# Patient Record
Sex: Male | Born: 1940 | Race: White | Hispanic: No | Marital: Single | Smoking: Never smoker
Health system: Southern US, Community
[De-identification: ages and names within clinical notes are randomized; demographics above are authoritative.]

## PROBLEM LIST (undated history)

## (undated) DIAGNOSIS — M4310 Spondylolisthesis, site unspecified: Secondary | ICD-10-CM

## (undated) DIAGNOSIS — K219 Gastro-esophageal reflux disease without esophagitis: Secondary | ICD-10-CM

## (undated) HISTORY — PX: OTHER SURGICAL HISTORY: SHX169

## (undated) HISTORY — PX: ANTERIOR CERVICAL DECOMP/DISCECTOMY FUSION: SHX1161

---

## 1998-03-25 ENCOUNTER — Other Ambulatory Visit: Admission: RE | Admit: 1998-03-25 | Discharge: 1998-03-25 | Payer: Self-pay | Admitting: Family Medicine

## 1998-11-19 ENCOUNTER — Encounter: Payer: Self-pay | Admitting: Neurosurgery

## 1998-11-20 ENCOUNTER — Ambulatory Visit (HOSPITAL_COMMUNITY): Admission: RE | Admit: 1998-11-20 | Discharge: 1998-11-20 | Payer: Self-pay | Admitting: Neurosurgery

## 1998-11-20 ENCOUNTER — Encounter: Payer: Self-pay | Admitting: Neurosurgery

## 1998-11-21 ENCOUNTER — Inpatient Hospital Stay (HOSPITAL_COMMUNITY): Admission: RE | Admit: 1998-11-21 | Discharge: 1998-11-22 | Payer: Self-pay | Admitting: Neurosurgery

## 1998-11-21 ENCOUNTER — Encounter: Payer: Self-pay | Admitting: Neurosurgery

## 2000-08-25 ENCOUNTER — Ambulatory Visit (HOSPITAL_BASED_OUTPATIENT_CLINIC_OR_DEPARTMENT_OTHER): Admission: RE | Admit: 2000-08-25 | Discharge: 2000-08-25 | Payer: Self-pay | Admitting: Orthopedic Surgery

## 2010-06-03 ENCOUNTER — Ambulatory Visit: Payer: Self-pay | Admitting: Family Medicine

## 2010-06-03 DIAGNOSIS — F329 Major depressive disorder, single episode, unspecified: Secondary | ICD-10-CM

## 2010-06-03 DIAGNOSIS — F528 Other sexual dysfunction not due to a substance or known physiological condition: Secondary | ICD-10-CM | POA: Insufficient documentation

## 2010-06-03 LAB — CONVERTED CEMR LAB
ALT: 19 units/L (ref 0–53)
AST: 17 units/L (ref 0–37)
Albumin: 3.9 g/dL (ref 3.5–5.2)
Alkaline Phosphatase: 51 units/L (ref 39–117)
BUN: 14 mg/dL (ref 6–23)
Basophils Absolute: 0 10*3/uL (ref 0.0–0.1)
Basophils Relative: 0.9 % (ref 0.0–3.0)
Bilirubin Urine: NEGATIVE
Bilirubin, Direct: 0.1 mg/dL (ref 0.0–0.3)
Blood in Urine, dipstick: NEGATIVE
CO2: 31 meq/L (ref 19–32)
Calcium: 8.8 mg/dL (ref 8.4–10.5)
Chloride: 109 meq/L (ref 96–112)
Cholesterol: 159 mg/dL (ref 0–200)
Creatinine, Ser: 0.8 mg/dL (ref 0.4–1.5)
Eosinophils Absolute: 0.2 10*3/uL (ref 0.0–0.7)
Eosinophils Relative: 3.6 % (ref 0.0–5.0)
GFR calc non Af Amer: 100.56 mL/min (ref >60)
Glucose, Bld: 92 mg/dL (ref 70–99)
Glucose, Urine, Semiquant: NEGATIVE
HCT: 43.8 % (ref 39.0–52.0)
HDL: 39.6 mg/dL (ref >39.00)
Hemoglobin: 14.8 g/dL (ref 13.0–17.0)
Ketones, urine, test strip: NEGATIVE
LDL Cholesterol: 103 mg/dL — ABNORMAL HIGH (ref 0–99)
Lymphocytes Relative: 30.3 % (ref 12.0–46.0)
Lymphs Abs: 1.7 10*3/uL (ref 0.7–4.0)
MCHC: 33.7 g/dL (ref 30.0–36.0)
MCV: 92.2 fL (ref 78.0–100.0)
Monocytes Absolute: 0.5 10*3/uL (ref 0.1–1.0)
Monocytes Relative: 9.5 % (ref 3.0–12.0)
Neutro Abs: 3.1 10*3/uL (ref 1.4–7.7)
Neutrophils Relative %: 55.7 % (ref 43.0–77.0)
Nitrite: NEGATIVE
PSA: 2.4 ng/mL (ref 0.10–4.00)
Platelets: 243 10*3/uL (ref 150.0–400.0)
Potassium: 4.4 meq/L (ref 3.5–5.1)
Protein, U semiquant: NEGATIVE
RBC: 4.75 M/uL (ref 4.22–5.81)
RDW: 15 % — ABNORMAL HIGH (ref 11.5–14.6)
Sodium: 141 meq/L (ref 135–145)
Specific Gravity, Urine: 1.02
TSH: 0.53 microintl units/mL (ref 0.35–5.50)
Total Bilirubin: 0.9 mg/dL (ref 0.3–1.2)
Total CHOL/HDL Ratio: 4
Total Protein: 7 g/dL (ref 6.0–8.3)
Triglycerides: 84 mg/dL (ref 0.0–149.0)
Urobilinogen, UA: 0.2
VLDL: 16.8 mg/dL (ref 0.0–40.0)
WBC Urine, dipstick: NEGATIVE
WBC: 5.5 10*3/uL (ref 4.5–10.5)
pH: 7

## 2010-07-16 ENCOUNTER — Ambulatory Visit: Payer: Self-pay | Admitting: Family Medicine

## 2010-10-30 ENCOUNTER — Emergency Department (HOSPITAL_COMMUNITY)
Admission: EM | Admit: 2010-10-30 | Discharge: 2010-10-30 | Payer: Self-pay | Source: Home / Self Care | Admitting: Emergency Medicine

## 2010-12-18 NOTE — Assessment & Plan Note (Signed)
Summary: new medicare pt/ pt will come in fasting/ok per doc/njr   Vital Signs:  Patient profile:   70 year old male Height:      70 inches Weight:      195 pounds BMI:     28.08 Temp:     98.2 degrees F oral BP sitting:   108 / 80  (left arm) Cuff size:   regular  Vitals Entered By: Kern Reap CMA Duncan Dull) (June 03, 2010 9:53 AM) CC: new to establish Is Patient Diabetic? No Pain Assessment Patient in pain? no        CC:  new to establish.  History of Present Illness:  Jared Hardy is a 70 year old, married x 2, divorced x 2, male, nonsmoker, who comes in today as a new patient for general physical examination and to discuss depression and erectile dysfunction.  He and his family were patients of mine many years ago.  I diagnosed his 17-year-old daughter to have acute leukemia.  She subsequently died.  His son Genelle Bal is now 37 years of age and lives in [place], [place].  He is divorced from his first wife remarried and divorced.  Again.  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  He has noticed a modest decrease in mood, mild depression.  No sleep dysfunction.  He also has some mild erectile dysfunction and would like to discuss fiber.  He gets routine eye care, dental care, colonoscopy, and GI x 4, normal, urologic exam yearly by Dr. Brunilda Payor.......... he wishes to continue that. relationship...Marland KitchenMarland KitchenMarland Kitchen vaccinations unknown, and will get copy from previous doctor Here for Medicare AWV:  1.   Risk factors based on Past M, S, F history:,,,,,,reviewed in detail 2.   Physical Activities: .......Marland Kitchenwalks daily 3.   Depression/mood: .......depressed as noted above 4.   Hearing: .......normal 5.   ADL's: ........normal 6.   Fall Risk:.......Marland Kitchenreviewed  7.   Home Safety: ........Marland Kitchenreviewed 8.   Height, weight, &visual acuity:.....Marland Kitchenheight weight, normal.  Vision normal 10.   Labs ordered based on risk factors:..........done today  11.           Referral  Coordination.........none 12.           Care Plan...........discuss medication use..... Celexa, and Viagra 13.            Cognitive Assessment ..........normal  Preventive Screening-Counseling & Management  Alcohol-Tobacco     Smoking Status: current     Packs/Day: <0.25      Drug Use:  no.    Allergies (verified): No Known Drug Allergies  Past History:  Past Medical History:  hearing loss  Past Surgical History: cervical anterior disketomy  Family History: Father: deceased Mother: deceased - colon caner Siblings: 1 brother  Social History: Occupation:financial advisor Single Current Smoker Alcohol use-yes Drug use-no Packs/Day:  <0.25 Smoking Status:  current Drug Use:  no  Review of Systems      See HPI  Physical Exam  General:  Well-developed,well-nourished,in no acute distress; alert,appropriate and cooperative throughout examination Head:  Normocephalic and atraumatic without obvious abnormalities. No apparent alopecia or balding. Eyes:  No corneal or conjunctival inflammation noted. EOMI. Perrla. Funduscopic exam benign, without hemorrhages, exudates or papilledema. Vision grossly normal. Ears:  External ear exam shows no significant lesions or deformities.  Otoscopic examination reveals clear canals, tympanic membranes are intact bilaterally without bulging, retraction, inflammation or discharge. Hearing is grossly normal bilaterally. Nose:  External nasal examination shows no deformity or inflammation. Nasal mucosa  are pink and moist without lesions or exudates. Mouth:  Oral mucosa and oropharynx without lesions or exudates.  Teeth in good repair. Neck:  No deformities, masses, or tenderness noted. Chest Wall:  No deformities, masses, tenderness or gynecomastia noted. Breasts:  No masses or gynecomastia noted Lungs:  Normal respiratory effort, chest expands symmetrically. Lungs are clear to auscultation, no crackles or wheezes. Heart:  Normal rate and  regular rhythm. S1 and S2 normal without gallop, murmur, click, rub or other extra sounds. Abdomen:  Bowel sounds positive,abdomen soft and non-tender without masses, organomegaly or hernias noted. Rectal:  uro Genitalia:  uro Prostate:  uro Msk:  No deformity or scoliosis noted of thoracic or lumbar spine.   Pulses:  R and L carotid,radial,femoral,dorsalis pedis and posterior tibial pulses are full and equal bilaterally Extremities:  No clubbing, cyanosis, edema, or deformity noted with normal full range of motion of all joints.   Neurologic:  No cranial nerve deficits noted. Station and gait are normal. Plantar reflexes are down-going bilaterally. DTRs are symmetrical throughout. Sensory, motor and coordinative functions appear intact. Skin:  Intact without suspicious lesions or rashes Cervical Nodes:  No lymphadenopathy noted Axillary Nodes:  No palpable lymphadenopathy Inguinal Nodes:  No significant adenopathy Psych:  Oriented X3 and flat affect.     Problems:  Medical Problems Added: 1)  Dx of Routine General Medical Exam@health  Care Facl  (ICD-V70.0) 2)  Dx of Erectile Dysfunction  (ICD-302.72) 3)  Dx of Depressive Disorder  (ICD-311)  Impression & Recommendations:  Problem # 1:  ERECTILE DYSFUNCTION (ICD-302.72) Assessment New  His updated medication list for this problem includes:    Viagra 50 Mg Tabs (Sildenafil citrate) ..... Uad  Orders: Venipuncture (16109) First annual wellness visit with prevention plan  [phone]) Urinalysis-dipstick only (Medicare patient) (09811BJ) TLB-Lipid Panel (80061-LIPID) TLB-BMP (Basic Metabolic Panel-BMET) (80048-METABOL) TLB-CBC Platelet - w/Differential (85025-CBCD) TLB-Hepatic/Liver Function Pnl (80076-HEPATIC) TLB-TSH (Thyroid Stimulating Hormone) (84443-TSH) TLB-PSA (Prostate Specific Antigen) (84153-PSA)  Problem # 2:  DEPRESSIVE DISORDER (ICD-311) Assessment: New  His updated medication list for this problem includes:     Celexa 20 Mg Tabs (Citalopram hydrobromide) .Marland Kitchen... 1 tab @ bedtime  Orders: Venipuncture (47829) First annual wellness visit with prevention plan  [phone]) Urinalysis-dipstick only (Medicare patient) (08657QI) TLB-Lipid Panel (80061-LIPID) TLB-BMP (Basic Metabolic Panel-BMET) (80048-METABOL) TLB-CBC Platelet - w/Differential (85025-CBCD) TLB-Hepatic/Liver Function Pnl (80076-HEPATIC) TLB-TSH (Thyroid Stimulating Hormone) (84443-TSH) TLB-PSA (Prostate Specific Antigen) (84153-PSA)  Problem # 3:  ROUTINE GENERAL MEDICAL EXAM@HEALTH  CARE FACL (ICD-V70.0) Assessment: New  Orders: Venipuncture (69629) First annual wellness visit with prevention plan  [phone]) Urinalysis-dipstick only (Medicare patient) (32440NU) EKG w/ Interpretation (93000) TLB-Lipid Panel (80061-LIPID) TLB-BMP (Basic Metabolic Panel-BMET) (80048-METABOL) TLB-CBC Platelet - w/Differential (85025-CBCD) TLB-Hepatic/Liver Function Pnl (80076-HEPATIC) TLB-TSH (Thyroid Stimulating Hormone) (84443-TSH) TLB-PSA (Prostate Specific Antigen) (84153-PSA)  Complete Medication List: 1)  Celexa 20 Mg Tabs (Citalopram hydrobromide) .Marland Kitchen.. 1 tab @ bedtime 2)  Viagra 50 Mg Tabs (Sildenafil citrate) .... Uad  Patient Instructions: 1)  begin Celexa 20 mg nightly return in 6 weeks for follow-up. 2)  Also, Viagra 50 mg........ one half tablet an hour prior to sex with water. 3)  Please give [place] a copy of your vaccination records 4)  It is important that you exercise regularly at least 20 minutes 5 times a week. If you develop chest pain, have severe difficulty breathing, or feel very tired , stop exercising immediately and seek medical attention. 5)  Take an Aspirin every day. Prescriptions: VIAGRA 50 MG TABS (SILDENAFIL CITRATE)  UAD  #6 x 11   Entered and Authorized by:   Roderick Pee MD   Signed by:   Roderick Pee MD on 06/03/2010   Method used:   Print then Give to Patient   RxID:   [id number] CELEXA 20 MG TABS (CITALOPRAM  HYDROBROMIDE) 1 tab @ bedtime  #50 x 1   Entered and Authorized by:   Roderick Pee MD   Signed by:   Roderick Pee MD on 06/03/2010   Method used:   Print then Give to Patient   RxID:   [id number]   Laboratory Results   Urine Tests  Date/Time Recieved: June 03, 2010 12:24 PM  Date/Time Reported: June 03, 2010 12:24 PM   Routine Urinalysis   Color: yellow Appearance: Clear Glucose: negative   (Normal Range: Negative) Bilirubin: negative   (Normal Range: Negative) Ketone: negative   (Normal Range: Negative) Spec. Gravity: 1.020   (Normal Range: 1.003-1.035) Blood: negative   (Normal Range: Negative) pH: 7.0   (Normal Range: 5.0-8.0) Protein: negative   (Normal Range: Negative) Urobilinogen: 0.2   (Normal Range: 0-1) Nitrite: negative   (Normal Range: Negative) Leukocyte Esterace: negative   (Normal Range: Negative)    Comments: Wynona Canes, CMA  June 03, 2010 12:24 PM

## 2010-12-18 NOTE — Assessment & Plan Note (Signed)
Summary: 6 wk follow up/cb   Vital Signs:  Patient profile:   70 year old male Weight:      198 pounds Temp:     97.9 degrees F oral BP sitting:   120 / 70  (left arm) Cuff size:   regular  Vitals Entered By: Kathrynn Speed CMA (July 16, 2010 8:57 AM) CC: 6 wk fu, src Flu Vaccine Consent Questions     Do you have a history of severe allergic reactions to this vaccine? no    Any prior history of allergic reactions to egg and/or gelatin? no    Do you have a sensitivity to the preservative Thimersol? no    Do you have a past history of Guillan-Barre Syndrome? no    Do you currently have an acute febrile illness? no    Have you ever had a severe reaction to latex? no    Vaccine information given and explained to patient? yes    Are you currently pregnant? no    Lot Number:[id number]   Exp Date:05/16/2011   Site Given  Left Deltoid IM   CC:  6 wk fu and src.  History of Present Illness: Jared Hardy  is a 70 year old male, who comes in today for reevaluation of depression, and erectile dysfunction.  He was taking Celexa 20 mg daily however, he states it did not help.  His mood and it caused a very negative effect on his libido therefore, he would like to get off of it.  He had some improvement with the 25 mg of Viagra however, would like to increase the dose.  Current Medications (verified): 1)  Celexa 20 Mg Tabs (Citalopram Hydrobromide) .Marland Kitchen.. 1 Tab @ Bedtime 2)  Viagra 50 Mg Tabs (Sildenafil Citrate) .... Uad  Allergies (verified): No Known Drug Allergies  Physical Exam  General:  Well-developed,well-nourished,in no acute distress; alert,appropriate and cooperative throughout examination   Impression & Recommendations:  Problem # 1:  ERECTILE DYSFUNCTION (ICD-302.72) Assessment Improved  His updated medication list for this problem includes:    Viagra 50 Mg Tabs (Sildenafil citrate) ..... Uad    Viagra 100 Mg Tabs (Sildenafil citrate) ..... Uad  Problem # 2:  DEPRESSIVE  DISORDER (ICD-311) Assessment: Unchanged  His updated medication list for this problem includes:    Celexa 20 Mg Tabs (Citalopram hydrobromide) .Marland Kitchen... 1 tab @ bedtime  Complete Medication List: 1)  Celexa 20 Mg Tabs (Citalopram hydrobromide) .Marland Kitchen.. 1 tab @ bedtime 2)  Viagra 50 Mg Tabs (Sildenafil citrate) .... Uad 3)  Viagra 100 Mg Tabs (Sildenafil citrate) .... Uad  Other Orders: Flu Vaccine 66yrs + (19147) Administration Flu vaccine - MCR (W2956)  Patient Instructions: 1)  taper these Celexa by taking a half a tablet every other day until the bottles empty. 2)  Let's increase the Viagra to 100 mg p.r.n. Prescriptions: VIAGRA 100 MG TABS (SILDENAFIL CITRATE) UAD  #6 x 11   Entered and Authorized by:   Roderick Pee MD   Signed by:   Roderick Pee MD on 07/16/2010   Method used:   Print then Give to Patient   RxID:   [id number]

## 2011-03-23 ENCOUNTER — Other Ambulatory Visit: Payer: Self-pay | Admitting: Neurosurgery

## 2011-03-23 DIAGNOSIS — M47816 Spondylosis without myelopathy or radiculopathy, lumbar region: Secondary | ICD-10-CM

## 2011-03-23 DIAGNOSIS — M48061 Spinal stenosis, lumbar region without neurogenic claudication: Secondary | ICD-10-CM

## 2011-03-26 ENCOUNTER — Ambulatory Visit
Admission: RE | Admit: 2011-03-26 | Discharge: 2011-03-26 | Disposition: A | Discharge status: Home / Self Care | Payer: Medicare Other | Source: Ambulatory Visit | Attending: Neurosurgery | Admitting: Neurosurgery

## 2011-03-26 ENCOUNTER — Other Ambulatory Visit: Payer: Self-pay

## 2011-03-26 DIAGNOSIS — M47816 Spondylosis without myelopathy or radiculopathy, lumbar region: Secondary | ICD-10-CM

## 2011-03-26 DIAGNOSIS — M48061 Spinal stenosis, lumbar region without neurogenic claudication: Secondary | ICD-10-CM

## 2011-04-03 NOTE — Op Note (Signed)
[place], [place]  Patient:    Jared Hardy, Jared Hardy                    MRN: [id number] Proc. Date: 08/25/00 Adm. Date:  [id number] Attending:  Marlowe Shores                           Operative Report  PREOPERATIVE DIAGNOSIS:  Retained foreign body, right thumb.  POSTOPERATIVE DIAGNOSIS:  Retained foreign body, right thumb.  PROCEDURE:  Removal of foreign body, deep right thumb.  SURGEON:  Artist Pais. Mina Marble, M.D.  ANESTHESIA:  IV sedation with local 2% plain Lidocaine block.  ASSISTANT:  Junius Roads. Ireton, P.A.C.  TOURNIQUET:  None.  COMPLICATIONS:  None.  DRAINS:  None.  PROCEDURE:  Patient was taken to operating room, and after the induction of adequate of IV sedation, the right upper extremity was prepped and draped in the usual sterile fashion.  Once this was done, 2% plain Lidocaine was injected into the distal aspect of the thumb along the radial border, distal to the IP flexion crease.  Once adequate anesthesia was obtained, a 2 cm incision was made in this area. Dissection was carried down to the area overlying the tendinous insertion of the SPL.  Once this was done, a wooden splinter was encountered.  This was approximately 15 mm at its greatest length, and probably 5 mm at its greatest diameter.  This was carefully excised.  The wound was then thoroughly irrigated and closed loosely with 5-0 nylon, dressed with Xeroform, 4x4 fluffs, and compressive Coban dressing.  Patient tolerated the procedure well, went to the recovery room in stable fashion. DD:  08/25/00 TD:  08/26/00 Job: [id number]

## 2011-10-22 ENCOUNTER — Encounter: Payer: Self-pay | Admitting: Family Medicine

## 2011-10-22 ENCOUNTER — Ambulatory Visit (INDEPENDENT_AMBULATORY_CARE_PROVIDER_SITE_OTHER): Payer: Medicare Other | Admitting: Family Medicine

## 2011-10-22 VITALS — BP 110/76 | Temp 97.6°F | Ht 69.0 in | Wt 197.0 lb

## 2011-10-22 DIAGNOSIS — Z136 Encounter for screening for cardiovascular disorders: Secondary | ICD-10-CM

## 2011-10-22 DIAGNOSIS — E7889 Other lipoprotein metabolism disorders: Secondary | ICD-10-CM

## 2011-10-22 DIAGNOSIS — N138 Other obstructive and reflux uropathy: Secondary | ICD-10-CM

## 2011-10-22 DIAGNOSIS — N139 Obstructive and reflux uropathy, unspecified: Secondary | ICD-10-CM

## 2011-10-22 DIAGNOSIS — N401 Enlarged prostate with lower urinary tract symptoms: Secondary | ICD-10-CM

## 2011-10-22 DIAGNOSIS — Z0000 Encounter for general adult medical examination without abnormal findings: Secondary | ICD-10-CM | POA: Insufficient documentation

## 2011-10-22 DIAGNOSIS — N529 Male erectile dysfunction, unspecified: Secondary | ICD-10-CM

## 2011-10-22 DIAGNOSIS — R5381 Other malaise: Secondary | ICD-10-CM

## 2011-10-22 DIAGNOSIS — R5383 Other fatigue: Secondary | ICD-10-CM

## 2011-10-22 DIAGNOSIS — E789 Disorder of lipoprotein metabolism, unspecified: Secondary | ICD-10-CM

## 2011-10-22 LAB — POCT URINALYSIS DIPSTICK
Blood, UA: NEGATIVE
Glucose, UA: NEGATIVE
Nitrite, UA: NEGATIVE
Spec Grav, UA: 1.02
Urobilinogen, UA: 0.2
pH, UA: 7

## 2011-10-22 LAB — BASIC METABOLIC PANEL
BUN: 16 mg/dL (ref 6–23)
CO2: 28 mEq/L (ref 19–32)
Calcium: 9.2 mg/dL (ref 8.4–10.5)
Chloride: 104 mEq/L (ref 96–112)
Creatinine, Ser: 0.9 mg/dL (ref 0.4–1.5)
GFR: 86.46 mL/min (ref >60.00)
Glucose, Bld: 101 mg/dL — ABNORMAL HIGH (ref 70–99)
Potassium: 4.7 mEq/L (ref 3.5–5.1)
Sodium: 140 mEq/L (ref 135–145)

## 2011-10-22 LAB — TSH: TSH: 0.91 u[IU]/mL (ref 0.35–5.50)

## 2011-10-22 LAB — LIPID PANEL
Cholesterol: 176 mg/dL (ref 0–200)
LDL Cholesterol: 117 mg/dL — ABNORMAL HIGH (ref 0–99)
VLDL: 13.2 mg/dL (ref 0.0–40.0)

## 2011-10-22 LAB — CBC WITH DIFFERENTIAL/PLATELET
Basophils Absolute: 0 10*3/uL (ref 0.0–0.1)
Basophils Relative: 0.7 % (ref 0.0–3.0)
Eosinophils Absolute: 0.1 10*3/uL (ref 0.0–0.7)
Eosinophils Relative: 1.4 % (ref 0.0–5.0)
HCT: 45.6 % (ref 39.0–52.0)
Hemoglobin: 15.4 g/dL (ref 13.0–17.0)
Lymphocytes Relative: 33.7 % (ref 12.0–46.0)
Lymphs Abs: 1.7 10*3/uL (ref 0.7–4.0)
MCHC: 33.9 g/dL (ref 30.0–36.0)
MCV: 96.9 fl (ref 78.0–100.0)
Monocytes Absolute: 0.5 10*3/uL (ref 0.1–1.0)
Monocytes Relative: 9.6 % (ref 3.0–12.0)
Neutro Abs: 2.7 10*3/uL (ref 1.4–7.7)
Neutrophils Relative %: 54.6 % (ref 43.0–77.0)
Platelets: 237 10*3/uL (ref 150.0–400.0)
RBC: 4.7 Mil/uL (ref 4.22–5.81)
RDW: 13.2 % (ref 11.5–14.6)
WBC: 5 10*3/uL (ref 4.5–10.5)

## 2011-10-22 LAB — HEPATIC FUNCTION PANEL
Alkaline Phosphatase: 54 U/L (ref 39–117)
Bilirubin, Direct: 0.1 mg/dL (ref 0.0–0.3)
Total Bilirubin: 1 mg/dL (ref 0.3–1.2)

## 2011-10-22 LAB — PSA: PSA: 2.49 ng/mL (ref 0.10–4.00)

## 2011-10-22 NOTE — Progress Notes (Signed)
  Subjective:    Patient ID: Jared Hardy, male    DOB: [birth year], 70 y.o.   MRN: [id number]  HPI Jared Hardy is a 70 year old single, nonsmoking male, who comes in today for Medicare wellness examination.  He is in good health.  He takes no medicine on a regular basis except for a daily aspirin.  He had surgery in the spring for carpal tunnel syndrome right hand.  He also had a neurosurgical evaluation of his back.  He was told he had degenerative disk disease and had epidural steroid injections x 2.  He gets routine eye care, hearing normal, regular dental care, colonoscopy, normal, the exhalation is up to date, cognitive function, normal, he walk some and daily basis, home health safety reviewed.  No issues identified, no guns in the house, he does have a healthcare power of attorney and living will   Review of Systems  Constitutional: Negative.   HENT: Negative.   Eyes: Negative.   Respiratory: Negative.   Cardiovascular: Negative.   Gastrointestinal: Negative.   Genitourinary: Negative.   Musculoskeletal: Negative.   Skin: Negative.   Neurological: Negative.   Hematological: Negative.   Psychiatric/Behavioral: Negative.        Objective:   Physical Exam  Constitutional: He is oriented to person, place, and time. He appears well-developed and well-nourished.  HENT:  Head: Normocephalic and atraumatic.  Right Ear: External ear normal.  Left Ear: External ear normal.  Nose: Nose normal.  Mouth/Throat: Oropharynx is clear and moist.  Eyes: Conjunctivae and EOM are normal. Pupils are equal, round, and reactive to light.  Neck: Normal range of motion. Neck supple. No JVD present. No tracheal deviation present. No thyromegaly present.  Cardiovascular: Normal rate, regular rhythm, normal heart sounds and intact distal pulses.  Exam reveals no gallop and no friction rub.   No murmur heard. Pulmonary/Chest: Effort normal and breath sounds normal. No stridor. No respiratory  distress. He has no wheezes. He has no rales. He exhibits no tenderness.  Abdominal: Soft. Bowel sounds are normal. He exhibits no distension and no mass. There is no tenderness. There is no rebound and no guarding.  Genitourinary:       Digital rectal exam and genital exam done recently by urology, therefore, not repeated  Musculoskeletal: Normal range of motion. He exhibits no edema and no tenderness.  Lymphadenopathy:    He has no cervical adenopathy.  Neurological: He is alert and oriented to person, place, and time. He has normal reflexes. No cranial nerve deficit. He exhibits normal muscle tone.  Skin: Skin is warm and dry. No rash noted. No erythema. No pallor.  Psychiatric: He has a normal mood and affect. His behavior is normal. Judgment and thought content normal.          Assessment & Plan:  Healthy male.  Return in one year, sooner for any problems.  Continue aspirin, one daily, and exercise

## 2011-10-22 NOTE — Patient Instructions (Signed)
Continue your current good health habits.  Take one aspirin tablet daily.  Return in one year for your Medicare wellness exam

## 2011-10-22 NOTE — Progress Notes (Signed)
Addended by: TODD, JEFFREY A on: 10/22/2011 11:30 AM   Modules accepted: Orders

## 2012-03-01 ENCOUNTER — Other Ambulatory Visit: Payer: Self-pay | Admitting: Neurosurgery

## 2012-03-01 DIAGNOSIS — M48061 Spinal stenosis, lumbar region without neurogenic claudication: Secondary | ICD-10-CM

## 2012-03-02 ENCOUNTER — Ambulatory Visit
Admission: RE | Admit: 2012-03-02 | Discharge: 2012-03-02 | Disposition: A | Discharge status: Home / Self Care | Payer: Medicare Other | Source: Ambulatory Visit | Attending: Neurosurgery | Admitting: Neurosurgery

## 2012-03-02 DIAGNOSIS — M48061 Spinal stenosis, lumbar region without neurogenic claudication: Secondary | ICD-10-CM

## 2012-06-21 ENCOUNTER — Other Ambulatory Visit: Payer: Self-pay | Admitting: Neurosurgery

## 2012-07-13 ENCOUNTER — Encounter (HOSPITAL_COMMUNITY): Payer: Self-pay | Admitting: Pharmacy Technician

## 2012-07-19 NOTE — Pre-Procedure Instructions (Addendum)
[address]   07/19/2012   Your procedure is scheduled on:  September 11th, Wednesday   Report to [facility] at  6:30 AM.  Call this number if you have problems the morning of surgery: [phone]   Remember:   Do not eat food or drink any liquids:After Midnight Tuesday.    Take these medicines the morning of surgery with A SIP OF WATER: Nothing             Discontinue aspirin, Plavix, Coumadin, Effient & Herbal medications 3-5 days prior to             Surgery.   Do not wear jewelry.   Do not wear lotions, powders, or colognes. You may NOT wear deodorant.  Ladies --Do not shave 48 hours prior to surgery. Men may shave face and neck.   Do not bring valuables to the hospital.  Contacts, dentures or bridgework may not be worn into surgery.   Leave suitcase in the car. After surgery it may be brought to your room.  For patients admitted to the hospital, checkout time is 11:00 AM the day of discharge.   Patients discharged the day of surgery will not be allowed to drive home.  Name and phone number of your driver:     Special Instructions: CHG Shower Use Special Wash: 1/2 bottle night before surgery and 1/2 bottle morning of surgery.   Please read over the following fact sheets that you were given: Pain Booklet, Coughing and Deep Breathing, Blood Transfusion Information, MRSA Information and Surgical Site Infection Prevention

## 2012-07-20 ENCOUNTER — Encounter (HOSPITAL_COMMUNITY): Payer: Self-pay

## 2012-07-20 ENCOUNTER — Ambulatory Visit (HOSPITAL_COMMUNITY)
Admission: RE | Admit: 2012-07-20 | Discharge: 2012-07-20 | Disposition: A | Discharge status: Home / Self Care | Payer: Medicare Other | Source: Ambulatory Visit | Attending: Neurosurgery | Admitting: Neurosurgery

## 2012-07-20 ENCOUNTER — Encounter (HOSPITAL_COMMUNITY)
Admission: RE | Admit: 2012-07-20 | Discharge: 2012-07-20 | Disposition: A | Discharge status: Home / Self Care | Payer: Medicare Other | Source: Ambulatory Visit | Attending: Neurosurgery | Admitting: Neurosurgery

## 2012-07-20 DIAGNOSIS — Z01812 Encounter for preprocedural laboratory examination: Secondary | ICD-10-CM | POA: Insufficient documentation

## 2012-07-20 DIAGNOSIS — Z01818 Encounter for other preprocedural examination: Secondary | ICD-10-CM | POA: Insufficient documentation

## 2012-07-20 LAB — CBC
MCH: 31.1 pg (ref 26.0–34.0)
MCHC: 33.6 g/dL (ref 30.0–36.0)
MCV: 92.7 fL (ref 78.0–100.0)
Platelets: 233 10*3/uL (ref 150–400)
RDW: 13.7 % (ref 11.5–15.5)

## 2012-07-20 LAB — SURGICAL PCR SCREEN: MRSA, PCR: NEGATIVE

## 2012-07-20 NOTE — Progress Notes (Addendum)
1345 wEDNESDAY....AFTER EXPLAINING THE IMPORTANCE OF GETTING BLOOD DRAWN NOW, PATIENT STATED HE HAD SOME IMPORTANT MEETINGS TO GO TO AND DID NOT WANT TO WEAR THE BLOOD BAND FOR  7 DAYS...he UNDERSTANDS THAT HE WILL HAVE BLOOD DRAWN TODAY AND THEN ANOTHER SAMPLE DRAWN THE DOS...Marland KitchenMarland KitchenDA  Had requested some info from [place]--its off site...they will try and retrieve it.Marland KitchenMarland KitchenDA

## 2012-07-26 MED ORDER — CEFAZOLIN SODIUM-DEXTROSE 2-3 GM-% IV SOLR
2.0000 g | INTRAVENOUS | Status: AC
  Administered 2012-07-27: 2 g via INTRAVENOUS
  Filled 2012-07-26: qty 50

## 2012-07-27 ENCOUNTER — Encounter (HOSPITAL_COMMUNITY)
Admission: RE | Disposition: A | Discharge status: Home / Self Care | Payer: Self-pay | Source: Ambulatory Visit | Attending: Neurosurgery

## 2012-07-27 ENCOUNTER — Encounter (HOSPITAL_COMMUNITY): Payer: Self-pay | Admitting: Certified Registered"

## 2012-07-27 ENCOUNTER — Inpatient Hospital Stay (HOSPITAL_COMMUNITY)
Admission: RE | Admit: 2012-07-27 | Discharge: 2012-07-29 | DRG: 460 | Disposition: A | Discharge status: Home / Self Care | Payer: Medicare Other | Source: Ambulatory Visit | Attending: Neurosurgery | Admitting: Neurosurgery

## 2012-07-27 ENCOUNTER — Inpatient Hospital Stay (HOSPITAL_COMMUNITY): Payer: Medicare Other | Admitting: Certified Registered"

## 2012-07-27 ENCOUNTER — Inpatient Hospital Stay (HOSPITAL_COMMUNITY): Payer: Medicare Other

## 2012-07-27 DIAGNOSIS — M47817 Spondylosis without myelopathy or radiculopathy, lumbosacral region: Principal | ICD-10-CM | POA: Diagnosis present

## 2012-07-27 DIAGNOSIS — Z981 Arthrodesis status: Secondary | ICD-10-CM

## 2012-07-27 DIAGNOSIS — F329 Major depressive disorder, single episode, unspecified: Secondary | ICD-10-CM | POA: Diagnosis present

## 2012-07-27 DIAGNOSIS — F3289 Other specified depressive episodes: Secondary | ICD-10-CM | POA: Diagnosis present

## 2012-07-27 DIAGNOSIS — M4316 Spondylolisthesis, lumbar region: Secondary | ICD-10-CM | POA: Diagnosis present

## 2012-07-27 DIAGNOSIS — Z7982 Long term (current) use of aspirin: Secondary | ICD-10-CM

## 2012-07-27 DIAGNOSIS — Q762 Congenital spondylolisthesis: Secondary | ICD-10-CM

## 2012-07-27 HISTORY — DX: Spondylolisthesis, site unspecified: M43.10

## 2012-07-27 HISTORY — PX: LUMBAR FUSION: SHX111

## 2012-07-27 HISTORY — DX: Gastro-esophageal reflux disease without esophagitis: K21.9

## 2012-07-27 LAB — ABO/RH: ABO/RH(D): A POS

## 2012-07-27 LAB — TYPE AND SCREEN: ABO/RH(D): A POS

## 2012-07-27 SURGERY — POSTERIOR LUMBAR FUSION 1 LEVEL
Anesthesia: General | Site: Back | Wound class: Clean

## 2012-07-27 MED ORDER — HYDROCODONE-ACETAMINOPHEN 5-325 MG PO TABS: 1.0000 | ORAL_TABLET | ORAL | Status: DC | PRN

## 2012-07-27 MED ORDER — MIDAZOLAM HCL 5 MG/5ML IJ SOLN
INTRAMUSCULAR | Status: DC | PRN
  Administered 2012-07-27: 2 mg via INTRAVENOUS

## 2012-07-27 MED ORDER — SODIUM CHLORIDE 0.9 % IJ SOLN
3.0000 mL | Freq: Two times a day (BID) | INTRAMUSCULAR | Status: DC
  Administered 2012-07-28: 3 mL via INTRAVENOUS

## 2012-07-27 MED ORDER — BACITRACIN 50000 UNITS IM SOLR
INTRAMUSCULAR | Status: AC
  Filled 2012-07-27: qty 1

## 2012-07-27 MED ORDER — MENTHOL 3 MG MT LOZG: 1.0000 | LOZENGE | OROMUCOSAL | Status: DC | PRN

## 2012-07-27 MED ORDER — OXYCODONE HCL 5 MG PO TABS: 5.0000 mg | ORAL_TABLET | Freq: Once | ORAL | Status: DC | PRN

## 2012-07-27 MED ORDER — HYDROMORPHONE HCL PF 1 MG/ML IJ SOLN
0.2500 mg | INTRAMUSCULAR | Status: DC | PRN
  Administered 2012-07-27: 0.5 mg via INTRAVENOUS

## 2012-07-27 MED ORDER — ONDANSETRON HCL 4 MG/2ML IJ SOLN: 4.0000 mg | Freq: Four times a day (QID) | INTRAMUSCULAR | Status: DC | PRN

## 2012-07-27 MED ORDER — ONDANSETRON HCL 4 MG/2ML IJ SOLN: 4.0000 mg | INTRAMUSCULAR | Status: DC | PRN

## 2012-07-27 MED ORDER — ONDANSETRON HCL 4 MG/2ML IJ SOLN: 4.0000 mg | Freq: Once | INTRAMUSCULAR | Status: DC | PRN

## 2012-07-27 MED ORDER — ICAPS PO CAPS: 2.0000 | ORAL_CAPSULE | Freq: Every day | ORAL | Status: DC

## 2012-07-27 MED ORDER — OMEGA-3 FATTY ACIDS 1000 MG PO CAPS: 2.0000 g | ORAL_CAPSULE | Freq: Every day | ORAL | Status: DC

## 2012-07-27 MED ORDER — SODIUM CHLORIDE 0.9 % IV SOLN
INTRAVENOUS | Status: AC
  Filled 2012-07-27: qty 500

## 2012-07-27 MED ORDER — HEMOSTATIC AGENTS (NO CHARGE) OPTIME
TOPICAL | Status: DC | PRN
  Administered 2012-07-27: 1 via TOPICAL

## 2012-07-27 MED ORDER — ZOLPIDEM TARTRATE 5 MG PO TABS: 5.0000 mg | ORAL_TABLET | Freq: Every evening | ORAL | Status: DC | PRN

## 2012-07-27 MED ORDER — LACTATED RINGERS IV SOLN
INTRAVENOUS | Status: DC | PRN
  Administered 2012-07-27 (×2): via INTRAVENOUS

## 2012-07-27 MED ORDER — PHENYLEPHRINE HCL 10 MG/ML IJ SOLN
10.0000 mg | INTRAMUSCULAR | Status: DC | PRN
  Administered 2012-07-27: 80 ug/min via INTRAVENOUS

## 2012-07-27 MED ORDER — ONE-DAILY MULTI VITAMINS PO TABS: 1.0000 | ORAL_TABLET | Freq: Every day | ORAL | Status: DC

## 2012-07-27 MED ORDER — ONDANSETRON HCL 4 MG/2ML IJ SOLN
INTRAMUSCULAR | Status: DC | PRN
  Administered 2012-07-27: 4 mg via INTRAVENOUS

## 2012-07-27 MED ORDER — DIAZEPAM 5 MG PO TABS: 5.0000 mg | ORAL_TABLET | Freq: Four times a day (QID) | ORAL | Status: DC | PRN

## 2012-07-27 MED ORDER — CEFAZOLIN SODIUM 1-5 GM-% IV SOLN
1.0000 g | Freq: Three times a day (TID) | INTRAVENOUS | Status: AC
  Administered 2012-07-27 (×2): 1 g via INTRAVENOUS
  Filled 2012-07-27 (×2): qty 50

## 2012-07-27 MED ORDER — PHENOL 1.4 % MT LIQD: 1.0000 | OROMUCOSAL | Status: DC | PRN

## 2012-07-27 MED ORDER — LIDOCAINE HCL (CARDIAC) 20 MG/ML IV SOLN
INTRAVENOUS | Status: DC | PRN
  Administered 2012-07-27: 100 mg via INTRAVENOUS

## 2012-07-27 MED ORDER — 0.9 % SODIUM CHLORIDE (POUR BTL) OPTIME
TOPICAL | Status: DC | PRN
  Administered 2012-07-27: 1000 mL

## 2012-07-27 MED ORDER — SENNA 8.6 MG PO TABS
1.0000 | ORAL_TABLET | Freq: Two times a day (BID) | ORAL | Status: DC
  Administered 2012-07-27 – 2012-07-28 (×3): 8.6 mg via ORAL
  Filled 2012-07-27 (×6): qty 1

## 2012-07-27 MED ORDER — HYDROMORPHONE HCL PF 1 MG/ML IJ SOLN
INTRAMUSCULAR | Status: AC
  Filled 2012-07-27: qty 1

## 2012-07-27 MED ORDER — ACETAMINOPHEN 650 MG RE SUPP: 650.0000 mg | RECTAL | Status: DC | PRN

## 2012-07-27 MED ORDER — POTASSIUM CHLORIDE IN NACL 20-0.9 MEQ/L-% IV SOLN
INTRAVENOUS | Status: DC
  Administered 2012-07-27 – 2012-07-28 (×2): via INTRAVENOUS
  Filled 2012-07-27 (×5): qty 1000

## 2012-07-27 MED ORDER — BISACODYL 5 MG PO TBEC: 5.0000 mg | DELAYED_RELEASE_TABLET | Freq: Every day | ORAL | Status: DC | PRN

## 2012-07-27 MED ORDER — OXYCODONE HCL 5 MG/5ML PO SOLN: 5.0000 mg | Freq: Once | ORAL | Status: DC | PRN

## 2012-07-27 MED ORDER — SODIUM CHLORIDE 0.9 % IJ SOLN: 3.0000 mL | INTRAMUSCULAR | Status: DC | PRN

## 2012-07-27 MED ORDER — POLYETHYLENE GLYCOL 3350 17 G PO PACK
17.0000 g | PACK | Freq: Every day | ORAL | Status: DC | PRN
  Filled 2012-07-27: qty 1

## 2012-07-27 MED ORDER — OXYCODONE-ACETAMINOPHEN 5-325 MG PO TABS
1.0000 | ORAL_TABLET | ORAL | Status: DC | PRN
  Administered 2012-07-27 – 2012-07-28 (×3): 2 via ORAL
  Administered 2012-07-28: 1 via ORAL
  Filled 2012-07-27 (×3): qty 2
  Filled 2012-07-27: qty 1

## 2012-07-27 MED ORDER — PROPOFOL 10 MG/ML IV BOLUS
INTRAVENOUS | Status: DC | PRN
  Administered 2012-07-27: 120 mg via INTRAVENOUS

## 2012-07-27 MED ORDER — MORPHINE SULFATE (PF) 1 MG/ML IV SOLN
INTRAVENOUS | Status: DC
  Administered 2012-07-27: 25 mg via INTRAVENOUS
  Administered 2012-07-27: 12 mg via INTRAVENOUS
  Administered 2012-07-27: 20 mg via INTRAVENOUS
  Administered 2012-07-27 – 2012-07-28 (×3): via INTRAVENOUS
  Administered 2012-07-28: 16.7 mg via INTRAVENOUS
  Administered 2012-07-28: 10.5 mg via INTRAVENOUS
  Filled 2012-07-27 (×3): qty 25

## 2012-07-27 MED ORDER — DIPHENHYDRAMINE HCL 12.5 MG/5ML PO ELIX: 12.5000 mg | ORAL_SOLUTION | Freq: Four times a day (QID) | ORAL | Status: DC | PRN

## 2012-07-27 MED ORDER — PROSIGHT PO TABS
2.0000 | ORAL_TABLET | Freq: Two times a day (BID) | ORAL | Status: DC
  Administered 2012-07-28: 2 via ORAL
  Filled 2012-07-27 (×7): qty 2

## 2012-07-27 MED ORDER — ADULT MULTIVITAMIN W/MINERALS CH
1.0000 | ORAL_TABLET | Freq: Every day | ORAL | Status: DC
  Administered 2012-07-27 – 2012-07-28 (×2): 1 via ORAL
  Filled 2012-07-27 (×3): qty 1

## 2012-07-27 MED ORDER — DIPHENHYDRAMINE HCL 50 MG/ML IJ SOLN: 12.5000 mg | Freq: Four times a day (QID) | INTRAMUSCULAR | Status: DC | PRN

## 2012-07-27 MED ORDER — SUFENTANIL CITRATE 50 MCG/ML IV SOLN
INTRAVENOUS | Status: DC | PRN
  Administered 2012-07-27: 5 ug via INTRAVENOUS
  Administered 2012-07-27: 10 ug via INTRAVENOUS
  Administered 2012-07-27: 20 ug via INTRAVENOUS
  Administered 2012-07-27: 10 ug via INTRAVENOUS

## 2012-07-27 MED ORDER — ACETAMINOPHEN 325 MG PO TABS: 650.0000 mg | ORAL_TABLET | ORAL | Status: DC | PRN

## 2012-07-27 MED ORDER — MAGNESIUM CITRATE PO SOLN
1.0000 | Freq: Once | ORAL | Status: AC | PRN
  Filled 2012-07-27: qty 296

## 2012-07-27 MED ORDER — OXYCODONE-ACETAMINOPHEN 5-325 MG PO TABS
ORAL_TABLET | ORAL | Status: AC
  Filled 2012-07-27: qty 2

## 2012-07-27 MED ORDER — SODIUM CHLORIDE 0.9 % IJ SOLN: 9.0000 mL | INTRAMUSCULAR | Status: DC | PRN

## 2012-07-27 MED ORDER — LIDOCAINE-EPINEPHRINE 0.5 %-1:200000 IJ SOLN
INTRAMUSCULAR | Status: DC | PRN
  Administered 2012-07-27: 20 mL

## 2012-07-27 MED ORDER — SODIUM CHLORIDE 0.9 % IV SOLN: 250.0000 mL | INTRAVENOUS | Status: DC

## 2012-07-27 MED ORDER — NALOXONE HCL 0.4 MG/ML IJ SOLN: 0.4000 mg | INTRAMUSCULAR | Status: DC | PRN

## 2012-07-27 MED ORDER — PHENYLEPHRINE HCL 10 MG/ML IJ SOLN
INTRAMUSCULAR | Status: DC | PRN
  Administered 2012-07-27 (×5): 80 ug via INTRAVENOUS

## 2012-07-27 MED ORDER — THROMBIN 20000 UNITS EX KIT
PACK | CUTANEOUS | Status: DC | PRN
  Administered 2012-07-27: 20000 [IU] via TOPICAL

## 2012-07-27 MED ORDER — ROCURONIUM BROMIDE 100 MG/10ML IV SOLN
INTRAVENOUS | Status: DC | PRN
  Administered 2012-07-27: 50 mg via INTRAVENOUS

## 2012-07-27 MED ORDER — OMEGA-3-ACID ETHYL ESTERS 1 G PO CAPS
2.0000 g | ORAL_CAPSULE | Freq: Every day | ORAL | Status: DC
  Administered 2012-07-27 – 2012-07-28 (×2): 2 g via ORAL
  Filled 2012-07-27 (×3): qty 2

## 2012-07-27 MED ORDER — MORPHINE SULFATE (PF) 1 MG/ML IV SOLN
INTRAVENOUS | Status: AC
  Filled 2012-07-27: qty 25

## 2012-07-27 MED ORDER — MEPERIDINE HCL 25 MG/ML IJ SOLN: 6.2500 mg | INTRAMUSCULAR | Status: DC | PRN

## 2012-07-27 SURGICAL SUPPLY — 65 items
5.5x 30mm shank ×4 IMPLANT
5.5x 40mm shank ×4 IMPLANT
BENZOIN TINCTURE PRP APPL 2/3 (GAUZE/BANDAGES/DRESSINGS) IMPLANT
BLADE SURG ROTATE 9660 (MISCELLANEOUS) IMPLANT
BONE MATRIX OSTEOCEL PLUS 5CC (Bone Implant) ×2 IMPLANT
BUR MATCHSTICK NEURO 3.0 LAGG (BURR) ×2 IMPLANT
CANISTER SUCTION 2500CC (MISCELLANEOUS) ×2 IMPLANT
CLOTH BEACON ORANGE TIMEOUT ST (SAFETY) ×2 IMPLANT
CONT SPEC 4OZ CLIKSEAL STRL BL (MISCELLANEOUS) ×4 IMPLANT
COVER BACK TABLE 24X17X13 BIG (DRAPES) ×2 IMPLANT
DECANTER SPIKE VIAL GLASS SM (MISCELLANEOUS) ×2 IMPLANT
DERMABOND ADVANCED (GAUZE/BANDAGES/DRESSINGS) ×1
DERMABOND ADVANCED .7 DNX12 (GAUZE/BANDAGES/DRESSINGS) ×1 IMPLANT
DRAPE C-ARM 42X72 X-RAY (DRAPES) ×4 IMPLANT
DRAPE C-ARMOR (DRAPES) ×2 IMPLANT
DRAPE LAPAROTOMY 100X72X124 (DRAPES) ×2 IMPLANT
DRAPE POUCH INSTRU U-SHP 10X18 (DRAPES) ×2 IMPLANT
DRAPE SURG 17X23 STRL (DRAPES) ×2 IMPLANT
DRESSING TELFA 8X3 (GAUZE/BANDAGES/DRESSINGS) IMPLANT
DURAPREP 26ML APPLICATOR (WOUND CARE) ×2 IMPLANT
ELECT REM PT RETURN 9FT ADLT (ELECTROSURGICAL) ×2
ELECTRODE REM PT RTRN 9FT ADLT (ELECTROSURGICAL) ×1 IMPLANT
GAUZE SPONGE 4X4 16PLY XRAY LF (GAUZE/BANDAGES/DRESSINGS) IMPLANT
GLOVE BIOGEL PI IND STRL 7.0 (GLOVE) ×1 IMPLANT
GLOVE BIOGEL PI IND STRL 8.5 (GLOVE) ×2 IMPLANT
GLOVE BIOGEL PI INDICATOR 7.0 (GLOVE) ×1
GLOVE BIOGEL PI INDICATOR 8.5 (GLOVE) ×2
GLOVE ECLIPSE 6.5 STRL STRAW (GLOVE) ×4 IMPLANT
GLOVE EXAM NITRILE LRG STRL (GLOVE) IMPLANT
GLOVE EXAM NITRILE MD LF STRL (GLOVE) IMPLANT
GLOVE EXAM NITRILE XL STR (GLOVE) IMPLANT
GLOVE EXAM NITRILE XS STR PU (GLOVE) IMPLANT
GLOVE SURG SS PI 6.5 STRL IVOR (GLOVE) ×4 IMPLANT
GLOVE SURG SS PI 8.0 STRL IVOR (GLOVE) ×6 IMPLANT
GOWN BRE IMP SLV AUR LG STRL (GOWN DISPOSABLE) ×6 IMPLANT
GOWN BRE IMP SLV AUR XL STRL (GOWN DISPOSABLE) IMPLANT
GOWN STRL REIN 2XL LVL4 (GOWN DISPOSABLE) ×4 IMPLANT
KIT BASIN OR (CUSTOM PROCEDURE TRAY) ×2 IMPLANT
KIT POSITION SURG JACKSON T1 (MISCELLANEOUS) ×2 IMPLANT
KIT ROOM TURNOVER OR (KITS) ×2 IMPLANT
LIGHT SOURCE ANGLE TIP STR 7FT (MISCELLANEOUS) ×2 IMPLANT
NEEDLE HYPO 25X1 1.5 SAFETY (NEEDLE) ×2 IMPLANT
NEEDLE SPNL 18GX3.5 QUINCKE PK (NEEDLE) ×2 IMPLANT
NS IRRIG 1000ML POUR BTL (IV SOLUTION) ×2 IMPLANT
PACK LAMINECTOMY NEURO (CUSTOM PROCEDURE TRAY) ×2 IMPLANT
PAD ARMBOARD 7.5X6 YLW CONV (MISCELLANEOUS) ×4 IMPLANT
ROD 5.5X40MM (Rod) ×4 IMPLANT
SCREW LOCK (Screw) ×4 IMPLANT
SCREW LOCK FXNS SPNE MAS PL (Screw) ×4 IMPLANT
SCREW TULIP 5.5 (Screw) ×8 IMPLANT
SPONGE GAUZE 4X4 12PLY (GAUZE/BANDAGES/DRESSINGS) IMPLANT
SPONGE LAP 4X18 X RAY DECT (DISPOSABLE) IMPLANT
SPONGE SURGIFOAM ABS GEL 100 (HEMOSTASIS) ×2 IMPLANT
STRIP CLOSURE SKIN 1/2X4 (GAUZE/BANDAGES/DRESSINGS) IMPLANT
SUT PROLENE 6 0 BV (SUTURE) IMPLANT
SUT VIC AB 0 CT1 18XCR BRD8 (SUTURE) ×1 IMPLANT
SUT VIC AB 0 CT1 8-18 (SUTURE) ×1
SUT VIC AB 2-0 CT1 18 (SUTURE) ×2 IMPLANT
SUT VIC AB 3-0 SH 8-18 (SUTURE) ×4 IMPLANT
SYR 20ML ECCENTRIC (SYRINGE) ×2 IMPLANT
TOWEL OR 17X24 6PK STRL BLUE (TOWEL DISPOSABLE) ×2 IMPLANT
TOWEL OR 17X26 10 PK STRL BLUE (TOWEL DISPOSABLE) ×2 IMPLANT
TRAY FOLEY CATH 14FRSI W/METER (CATHETERS) ×2 IMPLANT
WATER STERILE IRR 1000ML POUR (IV SOLUTION) ×2 IMPLANT
coroent interbody 14x9x28 ×4 IMPLANT

## 2012-07-27 NOTE — Transfer of Care (Signed)
Immediate Anesthesia Transfer of Care Note  Patient: Jared Hardy  Procedure(s) Performed: Procedure(s) (LRB) with comments: POSTERIOR LUMBAR FUSION 1 LEVEL (N/A) - Pedicle screws Lumbar four-five, Posterior lumbar interbody fusion lumbar four-five.  Patient Location: PACU  Anesthesia Type: General  Level of Consciousness: awake, alert  and oriented  Airway & Oxygen Therapy: Patient Spontanous Breathing and Patient connected to nasal cannula oxygen  Post-op Assessment: Report given to PACU RN and Post -op Vital signs reviewed and stable  Post vital signs: Reviewed and stable  Complications: No apparent anesthesia complications

## 2012-07-27 NOTE — Anesthesia Postprocedure Evaluation (Signed)
Anesthesia Post Note  Patient: Jared Hardy  Procedure(s) Performed: Procedure(s) (LRB): POSTERIOR LUMBAR FUSION 1 LEVEL (N/A)  Anesthesia type: general  Patient location: PACU  Post pain: Pain level controlled  Post assessment: Patient's Cardiovascular Status Stable  Last Vitals:  Filed Vitals:   07/27/12 1300  BP:   Pulse: 79  Temp:   Resp: 14    Post vital signs: Reviewed and stable  Level of consciousness: sedated  Complications: No apparent anesthesia complications

## 2012-07-27 NOTE — Progress Notes (Signed)
Orthopedic Tech Progress Note Patient Details:  Jared Hardy [birth year] [id number]  Patient ID: Jared Hardy, male   DOB: Apr 06, 1941, 71 y.o.   MRN: [id number]   Jared Hardy 07/27/2012, 5:51 PM LS SUPPORT WITH ANT. AND POST PANELS COMPLETED BY BIO TECH

## 2012-07-27 NOTE — Op Note (Signed)
07/27/2012  12:32 PM  PATIENT:  Jared Hardy  71 y.o. 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 Conservative measures have not helped and he has agreed to undergo operative decompression.  PRE-OPERATIVE DIAGNOSIS:  lumbar stenosis lumbar spondylosis, lumbar spondylolisthesis POST-OPERATIVE DIAGNOSIS:  lumbar stenosis lumbar spondylosis, lumbar spondylolisthesis L4/5  PROCEDURE:  Procedure(s): POSTERIOR LUMBAR Interboday FUSION 1 LEVEL L4/5 Nuvasive cages 46mmx14mm, morselized autograft Lumbar decompression in excess of what is needed for a PLIF at L4/5 Pedicle screws L4/5 Nuvasive Posterolateral arthrodesis L4/5 autograft SURGEON:  Surgeon(s): Carmela Hurt, MD  ASSISTANTS:none  ANESTHESIA:   general  EBL:  Total I/O In: 1000 [I.V.:1000] Out: 1200 [Urine:1000; Blood:200]  BLOOD ADMINISTERED:none  CELL SAVER GIVEN:none  COUNT:per nursing   DRAINS: none   SPECIMEN:  No Specimen  DICTATION: Mr. Demmons was brought to the operating room intubated and placed under a general anesthetic without difficulty. A foley catheter was placed under sterile conditions. He was rolled onto a Jackson table with all pressure points properly padded. His back was prepped and draped in a sterile fashion. I infiltrated 20 cc lidocaine into the midline of the lumbar region. I opened the skin with a 10 blade and took the incision down to the thoracolumbar fascia. I exposed the lamina of L3,4, and 5 bilaterally. I exposed the pars of L4 and L5 bilaterally.  I placed selfretaining retractors and started my pedicle screw placement. With fluoroscopic guidance I placed 5.53mm screws in a medial lateral trajectory at L4 and L5. Each screw was placed by drilling a pilot hole, then drilled , then tapped. My final screw placement looked good in both AP and lateral views. I then started my decompression by performing hemilaminectomies of L4 and L5 to decompress the spinal canal and both  L4 and L5 roots bilaterally. I aggressively removed the medial facets to open the lateral recesses of L4 in order to fully decompress the roots. I opened the disc space and removed the disc and endplates . I prepared the innerspace for arthrodesis. I felt 14mm x8degree cages would fit best. The cages were filled with local autograft. I placed the screws with fluoroscopic guidance. I then completed the posterlolateral Arthrodesis. I decorticated the lateral margins of l4 and l5 then placed autograft. I also placed more bone auto and allograft into the L4/5 disc space.  I completed the construct placing rods to connect the pedicle screws. Xray showed the construct to be in good postion, all screws and cages.  I closed the wound in layers approximating the thoracolumbar, subcutaneous,and subcuticular layers. I used dermabond for a sterile dressing.   PLAN OF CARE: Admit to inpatient   PATIENT DISPOSITION:  PACU - hemodynamically stable.   Delay start of Pharmacological VTE agent (>24hrs) due to surgical blood loss or risk of bleeding:  yes

## 2012-07-27 NOTE — Preoperative (Signed)
Beta Blockers   Reason not to administer Beta Blockers:Not Applicable 

## 2012-07-27 NOTE — Care Management Note (Signed)
    Page 1 of 1   07/29/2012     10:55:26 AM   CARE MANAGEMENT NOTE 07/29/2012  Patient:  Jared Hardy, Jared Hardy   Account Number:  [id number]  Date Initiated:  07/27/2012  Documentation initiated by:  Onnie Boer  Subjective/Objective Assessment:   PT WAS ADMITTED FOR A FUSION     Action/Plan:   PROGRESSION OF CARE AND DISCHARGE PLANNING   Anticipated DC Date:  07/29/2012   Anticipated DC Plan:  HOME W HOME HEALTH SERVICES      DC Planning Services  CM consult      Choice offered to / List presented to:             Status of service:  Completed, signed off Medicare Important Message given?   (If response is "NO", the following Medicare IM given date fields will be blank) Date Medicare IM given:   Date Additional Medicare IM given:    Discharge Disposition:  HOME/SELF CARE  Per UR Regulation:  Reviewed for med. necessity/level of care/duration of stay  If discussed at Long Length of Stay Meetings, dates discussed:    Comments:  07/27/12 Onnie Boer, RN, BSN 1431 PT WAS ADMITTED FOR A FUSION.  PTA PT WAS AT HOME WITH SELF CARE.  WILL F/U ON DC NEEDS AND RECOMMENDATIONS

## 2012-07-27 NOTE — H&P (Signed)
BP 127/78  Pulse 54  Temp 98.1 F (36.7 C) (Oral)  Resp 16  SpO2 97%            Jared Hardy is a long-term patient of mine whom I took to the operating room in 2000 for an ACDF at C 4-5 and 5-6 .  At that time, he had a significant amount of weakness in his upper extremities.  He noticed that he couldn't do his pushups the way that he normally did.  Postoperatively, he did wonderfully.  No problems and the last appointment that I had with him was in March of 2000 after undergoing his operation in January.  He returns today with complaints of pain in his spine and in his right upper extremity, constant numbness in his right hand which he has had since January 2nd, 2012 and occasionally over the past year.  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  Numbness is in the ring and middle finger, in his own words, of his right hand.  He has been evaluated by Dr. Regino Schultze of [facility].  He was treated with a steroid taper.  He didn't have injections yet because he wasn't sure what he wanted to do and he wanted to discuss things with me.  He says that the steroids did not do much for him.  He says the numbness is constant.  He has no real neck pain.  No pain really above the elbow.  He has been doing physical therapy without relief.  When he wakes up at night, he is frequently numb with tingling and pain in the right arm and/or hand.  He says he is not really sure if it is just the hand or if it does involve the arm.  He did undergo an MRI on 12/19/2010 .    DRUG ALLERGIES:                            No known drug allergies.    SOCIAL HISTORY:                           He does drink alcohol, wine frequently.  He does not use illicit drugs.  He is 5 ', 9 " tall weighing 198 lbs. and has a pulse of 64 .    REVIEW OF SYSTEMS:                    Positive for eyeglasses, arm pain.  He denies ear, nose, throat, mouth, cardiovascular, respiratory, gastrointestinal, genitourinary, skin,  neurological, psychiatric, endocrine, hematologic and allergic problems.    MEDICATIONS:                               Current medications are Aspirin, Omega-Herbalife, Cell Activaton, ARED, a multivitamin and Xtra-Cal-Herbalife.    EXAMINATION:                               On examination he is alert, oriented x 4 and answering all questions appropriately.  Memory, language, attention span and fund of knowledge are normal.  Well kempt and in no distress.  He has essentially normal strength in his upper extremities, but he has a markedly positive Tinel's sign over the right carpal tunnel which mimics his symptoms.  He has none over his left hand.  Negative over the cubital tunnel bilaterally.  He has 2 + reflexes biceps, triceps, brachioradialis, knees and ankles.  Downgoing toes to plantar stimulation.  No Hoffmann's sign.  No clonus.  Intact proprioception in the upper   and lower extremities.  Pupils are equal, round and reactive to light.  Full extraocular movements.  Full visual fields.  Symmetric facial sensation and movement.  Hearing intact to finger rub bilaterally.  Uvula elevates in the midline.  Shoulder shrug is normal.  Tongue protrudes in the midline.  There are no cervical masses or bruits.  Well healed cervical scar.  Lung fields clear.  Heart regular rhythm and rate.  No murmurs or rubs.  Pulse is good at the wrist bilaterally.  No clubbing, cyanosis or edema.  Oral mucosa is normal.  Sclera not injected.  Head is normocephalic, atraumatic.  Normal muscle tone, bulk and coordination.  Gait is normal.  Romberg test is negative.   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 Conservative treatment has not helped and at this time he wishes to undergo operative decompression at the L4/5 level, along with arthrodesis and pedicle screw augmentation. Risks including bleeding, infection, no relief, weakness, bowel bladder dysfunction, non union, need for further surgery, increased pain,  nerve damage were discussed. He understands and wishes to proceed.

## 2012-07-27 NOTE — Anesthesia Preprocedure Evaluation (Addendum)
Anesthesia Evaluation  Patient identified by MRN, date of birth, ID band Patient awake    Reviewed: Allergy & Precautions, H&P , NPO status , Patient's Chart, lab work & pertinent test results  Airway Mallampati: II TM Distance: >3 FB Neck ROM: Full    Dental  (+) Teeth Intact   Pulmonary          Cardiovascular     Neuro/Psych Depression    GI/Hepatic   Endo/Other    Renal/GU      Musculoskeletal   Abdominal   Peds  Hematology   Anesthesia Other Findings   Reproductive/Obstetrics                          Anesthesia Physical Anesthesia Plan  ASA: II  Anesthesia Plan: General   Post-op Pain Management:    Induction: Intravenous  Airway Management Planned: Oral ETT  Additional Equipment:   Intra-op Plan:   Post-operative Plan: Extubation in OR  Informed Consent: I have reviewed the patients History and Physical, chart, labs and discussed the procedure including the risks, benefits and alternatives for the proposed anesthesia with the patient or authorized representative who has indicated his/her understanding and acceptance.     Plan Discussed with: CRNA, Surgeon and Anesthesiologist  Anesthesia Plan Comments:        Anesthesia Quick Evaluation

## 2012-07-27 NOTE — Progress Notes (Signed)
Patient ID: Jared Hardy, male   DOB: [birth year], 71 y.o.   MRN: [id number] Alert and oriented x 4 Moving all extremities well Wound is clean, no signs of infection Doing well post op

## 2012-07-28 ENCOUNTER — Encounter (HOSPITAL_COMMUNITY): Payer: Self-pay | Admitting: General Practice

## 2012-07-28 MED ORDER — CYCLOBENZAPRINE HCL 10 MG PO TABS: 10.0000 mg | ORAL_TABLET | Freq: Three times a day (TID) | ORAL | Status: AC | PRN

## 2012-07-28 MED ORDER — OXYCODONE-ACETAMINOPHEN 5-325 MG PO TABS: 1.0000 | ORAL_TABLET | Freq: Four times a day (QID) | ORAL | Status: AC | PRN

## 2012-07-28 NOTE — Progress Notes (Signed)
Occupational Therapy Evaluation Patient Details Name: Jared Hardy MRN: [id number] DOB: 1940-11-26 Today's Date: 07/28/2012 Time: 4098-1191 OT Time Calculation (min): 34 min  OT Assessment / Plan / Recommendation Clinical Impression  Pt s/p posterior lumbar fusion. Will benefit from acute OT services to address below problem list in prep for d/c home.    OT Assessment  Patient needs continued OT Services    Follow Up Recommendations  Supervision/Assistance - 24 hour    Barriers to Discharge      Equipment Recommendations  None recommended by PT;None recommended by OT    Recommendations for Other Services    Frequency  Min 2X/week    Precautions / Restrictions Precautions Precautions: Back Precaution Booklet Issued: Yes (comment) Precaution Comments: pt educated on 3  back precautions. Pt required cueing throughout session to maintain back precautions during activity Restrictions Weight Bearing Restrictions: No   Pertinent Vitals/Pain See vitals    ADL  Upper Body Dressing: Performed;Supervision/safety Where Assessed - Upper Body Dressing: Unsupported standing Lower Body Dressing: Simulated;Supervision/safety Where Assessed - Lower Body Dressing: Unsupported sitting Toilet Transfer: Simulated;Supervision/safety Toilet Transfer Method: Sit to Barista:  (chair) Equipment Used: Back brace Transfers/Ambulation Related to ADLs: supervision for safety and to maintain back precautions ADL Comments: Pt is overall at supervision but consistently breaks back precautions during mobility and ADL tasks. Discussed with wife and pt importance of maintaining back precautions, but will benefit from continued reinforcement.  Educated pt and wife on proper positioning and donning/doffing back brace, and pt able to return demonstration.    OT Diagnosis: Acute pain  OT Problem List: Decreased knowledge of precautions;Pain OT Treatment Interventions: Self-care/ADL  training;Therapeutic activities;Patient/family education   OT Goals Acute Rehab OT Goals OT Goal Formulation: With patient Time For Goal Achievement: 08/04/12 Potential to Achieve Goals: Good ADL Goals Pt Will Transfer to Toilet: with modified independence;Ambulation;Comfort height toilet;Maintaining back safety precautions ADL Goal: Toilet Transfer - Progress: Goal set today Miscellaneous OT Goals Miscellaneous OT Goal #1: Pt will independently verbalize and generalize 3/3 back precautions during all ADL tasks. OT Goal: Miscellaneous Goal #1 - Progress: Goal set today Miscellaneous OT Goal #2: Pt will perform bed mobility with mod I in prep for EOB ADLs. OT Goal: Miscellaneous Goal #2 - Progress: Goal set today  Visit Information  Last OT Received On: 07/28/12 Assistance Needed: +1    Subjective Data      Prior Functioning  Vision/Perception  Home Living Lives With: Spouse Available Help at Discharge: Family;Available 24 hours/day Type of Home: House Home Access: Level entry Home Layout: One level Bathroom Shower/Tub: Walk-in shower;Door [employer] Toilet: Pharmacist, community: Yes How Accessible: Accessible via walker Home Adaptive Equipment: None Prior Function Level of Independence: Independent Able to Take Stairs?: Yes Driving: Yes Vocation: Retired Musician: No difficulties Dominant Hand: Right      Cognition  Overall Cognitive Status: Appears within functional limits for tasks assessed/performed Arousal/Alertness: Awake/alert Orientation Level: Appears intact for tasks assessed Behavior During Session: [facility] for tasks performed    Extremity/Trunk Assessment Right Upper Extremity Assessment RUE ROM/Strength/Tone: Within functional levels Left Upper Extremity Assessment LUE ROM/Strength/Tone: Within functional levels Right Lower Extremity Assessment RLE ROM/Strength/Tone: Within functional levels RLE Sensation: WFL - Light  Touch Left Lower Extremity Assessment LLE ROM/Strength/Tone: Within functional levels LLE Sensation: WFL - Light Touch   Mobility  Shoulder Instructions  Bed Mobility Bed Mobility: Rolling Left;Left Sidelying to Sit;Sit to Sidelying Left;Sitting - Scoot to Edge of Bed Rolling Left:  4: Min guard Left Sidelying to Sit: 4: Min guard Sitting - Scoot to [place] of Bed: 5: Supervision Sit to Sidelying Left: 4: Min assist Details for Bed Mobility Assistance: VC for proper sequencing to maintain back precautions. Min assist from sit to sidelying with LEs. Increased emphasis on maintaining back precautions and proper technique Transfers Transfers: Sit to Stand;Stand to Sit Sit to Stand: 5: Supervision;With upper extremity assist;From bed;From chair/3-in-1 Stand to Sit: 5: Supervision;With upper extremity assist;To chair/3-in-1 Details for Transfer Assistance: VC for safe hand placement and technique to maintain back precautions       Exercise     Balance     End of Session OT - End of Session Equipment Utilized During Treatment: Back brace Activity Tolerance: Patient tolerated treatment well Patient left: in chair;with call bell/phone within reach;with family/visitor present Nurse Communication: Mobility status  GO    07/28/2012 [name] OTR/L Pager [phone] Office [phone] [name] 07/28/2012, 4:37 PM

## 2012-07-28 NOTE — Progress Notes (Signed)
Patient ID: Jared Hardy, male   DOB: June 18, 1941, 71 y.o.   MRN: [id number] Alert and oriented x 4 5/5 strength in lower extremities Drainage from wound, i changed the dressing. No erythema Doing well anticipate d/c tomorrow.

## 2012-07-28 NOTE — Evaluation (Signed)
Physical Therapy Evaluation Patient Details Name: Jared Hardy MRN: [id number] DOB: January 14, 1941 Today's Date: 07/28/2012 Time: 4098-1191 PT Time Calculation (min): 29 min  PT Assessment / Plan / Recommendation Clinical Impression  Pt s/p PLF L4-5. Pt is near baseline functional level. Pt requires increased cueing to maintain back precautions for safe mobility. Pt will benefit from skilled PT in the acute care setting in order to maximize functional mobility and safety prior to d/c home with wife    PT Assessment  Patient needs continued PT services    Follow Up Recommendations  No PT follow up;Supervision for mobility/OOB    Barriers to Discharge        Equipment Recommendations  None recommended by PT    Recommendations for Other Services     Frequency Min 5X/week    Precautions / Restrictions Precautions Precautions: Back Precaution Booklet Issued: Yes (comment) Precaution Comments: pt educated on 3  back precautions. Pt required cueing throughout session to maintain back precautions during activity Restrictions Weight Bearing Restrictions: No   Pertinent Vitals/Pain Pain 4/10 with bed mobility. RN aware.       Mobility  Bed Mobility Bed Mobility: Rolling Left;Left Sidelying to Sit;Sit to Sidelying Left;Sitting - Scoot to Edge of Bed Rolling Left: 4: Min guard Left Sidelying to Sit: 4: Min guard Sitting - Scoot to [place] of Bed: 5: Supervision Sit to Sidelying Left: 4: Min assist Details for Bed Mobility Assistance: VC for proper sequencing to maintain back precautions. Min assist from sit to sidelying with LEs. Increased emphasis on maintaining back precautions and proper technique Transfers Transfers: Sit to Stand;Stand to Sit Sit to Stand: 5: Supervision;With upper extremity assist;From bed;From chair/3-in-1 Stand to Sit: 5: Supervision;With upper extremity assist;To chair/3-in-1 Details for Transfer Assistance: VC for safe hand placement and technique to maintain  back precautions Ambulation/Gait Ambulation/Gait Assistance: 5: Supervision Ambulation Distance (Feet): 400 Feet Assistive device: None Ambulation/Gait Assistance Details: Pt with good ambulation, no physical assist required. Cueing to avoid twisting when obstacles prsent in hallway Gait Pattern: Within Functional Limits    Exercises     PT Diagnosis: Acute pain  PT Problem List: Decreased activity tolerance;Decreased mobility;Decreased knowledge of use of DME;Decreased safety awareness;Decreased knowledge of precautions;Pain PT Treatment Interventions: DME instruction;Gait training;Stair training;Functional mobility training;Therapeutic activities;Patient/family education   PT Goals Acute Rehab PT Goals PT Goal Formulation: With patient Time For Goal Achievement: 08/04/12 Potential to Achieve Goals: Good Pt will Roll Supine to Right Side: with modified independence PT Goal: Rolling Supine to Right Side - Progress: Goal set today Pt will Roll Supine to Left Side: with modified independence PT Goal: Rolling Supine to Left Side - Progress: Goal set today Pt will go Supine/Side to Sit: with modified independence PT Goal: Supine/Side to Sit - Progress: Goal set today Pt will go Sit to Supine/Side: with modified independence PT Goal: Sit to Supine/Side - Progress: Goal set today Pt will go Sit to Stand: with modified independence PT Goal: Sit to Stand - Progress: Goal set today Pt will go Stand to Sit: with modified independence PT Goal: Stand to Sit - Progress: Goal set today Pt will Transfer Bed to Chair/Chair to Bed: with modified independence PT Transfer Goal: Bed to Chair/Chair to Bed - Progress: Goal set today Pt will Ambulate: >150 feet;with modified independence;with least restrictive assistive device PT Goal: Ambulate - Progress: Goal set today Additional Goals Additional Goal #1: Pt will maintain 3/3 back precautions during all mobility PT Goal: Additional Goal #1 -  Progress: Goal set today  Visit Information  Last PT Received On: 07/28/12 Assistance Needed: +1 PT/OT Co-Evaluation/Treatment: Yes    Subjective Data  Patient Stated Goal: to go home tomorrow   Prior Functioning  Home Living Lives With: Spouse Available Help at Discharge: Family;Available 24 hours/day Type of Home: House Home Access: Level entry Home Layout: One level Bathroom Shower/Tub: Walk-in shower;Door [employer] Toilet: Standard Bathroom Accessibility: Yes How Accessible: Accessible via walker Home Adaptive Equipment: None Prior Function Level of Independence: Independent Able to Take Stairs?: Yes Driving: Yes Vocation: Retired Musician: No difficulties Dominant Hand: Right    Cognition  Overall Cognitive Status: Appears within functional limits for tasks assessed/performed Arousal/Alertness: Awake/alert Orientation Level: Appears intact for tasks assessed Behavior During Session: [facility] for tasks performed    Extremity/Trunk Assessment Right Upper Extremity Assessment RUE ROM/Strength/Tone: Within functional levels Left Upper Extremity Assessment LUE ROM/Strength/Tone: Within functional levels Right Lower Extremity Assessment RLE ROM/Strength/Tone: Within functional levels RLE Sensation: WFL - Light Touch Left Lower Extremity Assessment LLE ROM/Strength/Tone: Within functional levels LLE Sensation: WFL - Light Touch   Balance    End of Session PT - End of Session Equipment Utilized During Treatment: Gait belt;Back brace Activity Tolerance: Patient tolerated treatment well Patient left: in chair;with call bell/phone within reach;with family/visitor present Nurse Communication: Mobility status    Milana Kidney 07/28/2012, 4:34 PM  07/28/2012 Milana Kidney DPT PAGER: [phone] OFFICE: [phone]

## 2012-07-29 NOTE — Progress Notes (Signed)
Occupational Therapy Treatment Patient Details Name: Jared Hardy MRN: [id number] DOB: 1940/12/29 Today's Date: 07/29/2012 Time: 4098-1191 OT Time Calculation (min): 11 min  OT Assessment / Plan / Recommendation Comments on Treatment Session Pt planning to d/c home this morning.    Follow Up Recommendations  Supervision/Assistance - 24 hour    Barriers to Discharge       Equipment Recommendations  None recommended by OT    Recommendations for Other Services    Frequency Min 2X/week   Plan Discharge plan remains appropriate;All goals met and education completed, patient discharged from OT services    Precautions / Restrictions Precautions Precautions: Back Precaution Comments: pt able to verbalize 3/3 back precautions Restrictions Weight Bearing Restrictions: No   Pertinent Vitals/Pain See vitals    ADL  Grooming: Performed;Teeth care;Modified independent Where Assessed - Grooming: Unsupported standing Lower Body Dressing: Performed;Modified independent Where Assessed - Lower Body Dressing: Unsupported sit to stand Toilet Transfer: Simulated;Modified independent Toilet Transfer Method: Sit to Barista:  (chair) Equipment Used: Back brace Transfers/Ambulation Related to ADLs: mod I throughout room ADL Comments: Pt preparing for discharge upon OT arrival.  Pt doing a better job today adhering to back precautions.Pt and wife report  no difficulty with bed mobility as pt has practiced it.    OT Diagnosis:    OT Problem List:   OT Treatment Interventions:     OT Goals Acute Rehab OT Goals OT Goal Formulation: With patient Time For Goal Achievement: 08/04/12 Potential to Achieve Goals: Good ADL Goals Pt Will Transfer to Toilet: with modified independence;Ambulation;Comfort height toilet;Maintaining back safety precautions ADL Goal: Toilet Transfer - Progress: Met Miscellaneous OT Goals Miscellaneous OT Goal #1: Pt will independently verbalize  and generalize 3/3 back precautions during all ADL tasks. OT Goal: Miscellaneous Goal #1 - Progress: Met Miscellaneous OT Goal #2: Pt will perform bed mobility with mod I in prep for EOB ADLs. OT Goal: Miscellaneous Goal #2 - Progress: Met  Visit Information  Last OT Received On: 07/29/12 Assistance Needed: +1    Subjective Data      Prior Functioning       Cognition  Overall Cognitive Status: Appears within functional limits for tasks assessed/performed Arousal/Alertness: Awake/alert Orientation Level: Appears intact for tasks assessed Behavior During Session: [facility] for tasks performed    Mobility  Shoulder Instructions Bed Mobility Bed Mobility: Not assessed Transfers Sit to Stand: 7: Independent Stand to Sit: 7: Independent       Exercises      Balance     End of Session OT - End of Session Equipment Utilized During Treatment: Back brace Activity Tolerance: Patient tolerated treatment well Patient left: in chair;with call bell/phone within reach;with family/visitor present;with nursing in room Nurse Communication: Mobility status  GO    07/29/2012 [name] OTR/L Pager [phone] Office [phone]  [name] 07/29/2012, 10:11 AM

## 2012-07-29 NOTE — Progress Notes (Signed)
Clinical Social Worker received referral for new snf placement. Clinical Social Worker reviewed chart and noted PT/OT evaluations that pt does not need PT/OT follow up at discharge. Inappropriate Clinical Social Worker referral. Clinical Social Worker signing off. Please re-consult if social work needs arise.  Jacklynn Lewis, MSW, LCSWA  Clinical Social Work [phone]

## 2012-07-29 NOTE — Discharge Summary (Signed)
Physician Discharge Summary  Patient ID: Khale Nigh MRN: [id number] DOB/AGE: Aug 31, 1941 71 y.o.  Admit date: 07/27/2012 Discharge date: 07/29/2012  Admission Diagnoses:Spondylolisthesis L4/5, lumbar stenosis L4/5  Discharge Diagnoses: Lumbar stenosis Principal Problem:  *Spondylolisthesis of lumbar region   Discharged Condition: good  Hospital Course: Mr. Kabat was admitted and taken to the operating room for an uncomplicated POSTERIOR LUMBAR Interboday FUSION 1 LEVEL L4/5 Nuvasive cages 69mmx14mm, morselized autograft  Lumbar decompression in excess of what is needed for a PLIF at L4/5  Pedicle screws L4/5 Nuvasive  Posterolateral arthrodesis L4/5 autograft. Post op he did extraordinarily well. At discharge he was ambulating, voiding and tolerating a regular diet. His wound was clean and dry and without signs of infection.     Consults: None  Significant Diagnostic Studies: no  Treatments: surgery: POSTERIOR LUMBAR Interboday FUSION 1 LEVEL L4/5 Nuvasive cages 51mmx14mm, morselized autograft  Lumbar decompression in excess of what is needed for a PLIF at L4/5  Pedicle screws L4/5 Nuvasive  Posterolateral arthrodesis L4/5 autograft   Discharge Exam: Blood pressure 107/77, pulse 88, temperature 98.9 F (37.2 C), temperature source Oral, resp. rate 18, height 5\' 9"  (1.753 m), weight 90.266 kg (199 lb), SpO2 95.00%. General appearance: alert, cooperative and appears stated age Neurologic: Alert and oriented X 3, normal strength and tone. Normal symmetric reflexes. Normal coordination and gait  Disposition: 01-Home or Self Care     Medication List     As of 07/29/2012 10:01 PM    TAKE these medications         cyclobenzaprine 10 MG tablet   Commonly known as: FLEXERIL   Take 1 tablet (10 mg total) by mouth 3 (three) times daily as needed for muscle spasms.      fish oil-omega-3 fatty acids 1000 MG capsule   Take 2 g by mouth daily.      ICAPS Caps   Take 2  capsules by mouth daily.      multivitamin tablet   Take 1 tablet by mouth daily.      oxyCODONE-acetaminophen 5-325 MG per tablet   Commonly known as: PERCOCET/ROXICET   Take 1-2 tablets by mouth every 6 (six) hours as needed for pain.           Follow-up Information    Follow up with Merlie Noga L, MD. In 4 weeks. (call to make appt)    Contact information:   [address] [place] [address] [phone]          Signed: Cedrica Brune L 07/29/2012, 10:01 PM

## 2012-07-29 NOTE — Progress Notes (Signed)
Physical Therapy Treatment Patient Details Name: Jared Hardy MRN: [id number] DOB: Dec 13, 1940 Today's Date: 07/29/2012 Time: 8295-6213 PT Time Calculation (min): 13 min  PT Assessment / Plan / Recommendation Comments on Treatment Session  Pt is near baseline functional level, decreased activity tolerance secondary to pain with increased movement. Pt still required min cueing to maintain back precautions during mobility. Will continue per plan, pt may d/c home today    Follow Up Recommendations  No PT follow up;Supervision for mobility/OOB    Barriers to Discharge        Equipment Recommendations  None recommended by PT;None recommended by OT    Recommendations for Other Services    Frequency Min 5X/week   Plan Discharge plan remains appropriate;Frequency remains appropriate    Precautions / Restrictions Precautions Precautions: Back Precaution Comments: pt able to verbalize and maintain with min cueing 3/3 back precautions Restrictions Weight Bearing Restrictions: No   Pertinent Vitals/Pain Increased pain with activity, 5/10.     Mobility  Bed Mobility Bed Mobility: Not assessed Transfers Transfers: Sit to Stand;Stand to Sit Sit to Stand: 7: Independent Stand to Sit: 7: Independent Ambulation/Gait Ambulation/Gait Assistance: 6: Modified independent (Device/Increase time) Ambulation Distance (Feet): 400 Feet Assistive device: None Ambulation/Gait Assistance Details: Antalgic gait with 2nd half of ambulation trial.  Gait Pattern: Within Functional Limits Gait velocity: normal gait speed Stairs: Yes Stairs Assistance: 5: Supervision Stairs Assistance Details (indicate cue type and reason): VC for proper sequencing. Pt altnernated between reciprocal and step-to gait pattern Stair Management Technique: One rail Right;Alternating pattern;Step to pattern;Forwards Number of Stairs: 10     Exercises     PT Diagnosis:    PT Problem List:   PT Treatment Interventions:      PT Goals Acute Rehab PT Goals PT Goal: Sit to Stand - Progress: Met PT Goal: Stand to Sit - Progress: Met PT Transfer Goal: Bed to Chair/Chair to Bed - Progress: Met PT Goal: Ambulate - Progress: Progressing toward goal Additional Goals PT Goal: Additional Goal #1 - Progress: Progressing toward goal  Visit Information  Last PT Received On: 07/29/12 Assistance Needed: +1    Subjective Data      Cognition  Overall Cognitive Status: Appears within functional limits for tasks assessed/performed Arousal/Alertness: Awake/alert Orientation Level: Appears intact for tasks assessed Behavior During Session: [facility] for tasks performed    Balance     End of Session PT - End of Session Equipment Utilized During Treatment: Gait belt;Back brace Activity Tolerance: Patient tolerated treatment well Patient left: in chair;with call bell/phone within reach;with family/visitor present Nurse Communication: Mobility status   GP     Milana Kidney 07/29/2012, 9:37 AM  07/29/2012 Milana Kidney DPT PAGER: [phone] OFFICE: [phone]

## 2012-07-29 NOTE — Progress Notes (Signed)
Dc instructions provided. Pt verbalized understanding. Dressing changed due to leakage. Pt under no s/s distress. Prescriptions given to pt by MD.

## 2012-08-29 ENCOUNTER — Telehealth: Payer: Self-pay | Admitting: Family Medicine

## 2012-08-29 NOTE — Telephone Encounter (Signed)
Caller: [place]/Patient; Patient Name: Jared Hardy; PCP: [facility]); Best Callback Phone Number: [phone] Lumbar fusion 5 weeks ago and was taking strong pain meds and has been having trouble with constipation for past 2 weeks. He stopped taking the narcotic pain medicine 3 weeks ago. Is only going small amounts every few days and has tried 2 enemas over past week and had good results the first time.  Has used mineral oil and phosa laxative and did not help much. Using stir in fiber daily. He is drinking 4-5 glasses of liquid per day but will try to increase fluids. Triage and Care advice per Constipation Protocol and appointment advised within 24 hours. He is going to try some Miralax per package instructions tonight and will call back on 08/30/12 for appointment if no results.

## 2013-11-13 IMAGING — CR DG LUMBAR SPINE 1V
1 series · 1 of 1 positions shown · non-contrast
Comparison: MRI of 03/02/12.  Plain films of 03/26/2011.

CLINICAL DATA: Posterior fixation at L4-L5.

LUMBAR SPINE - 1 VIEW

[view not recorded]
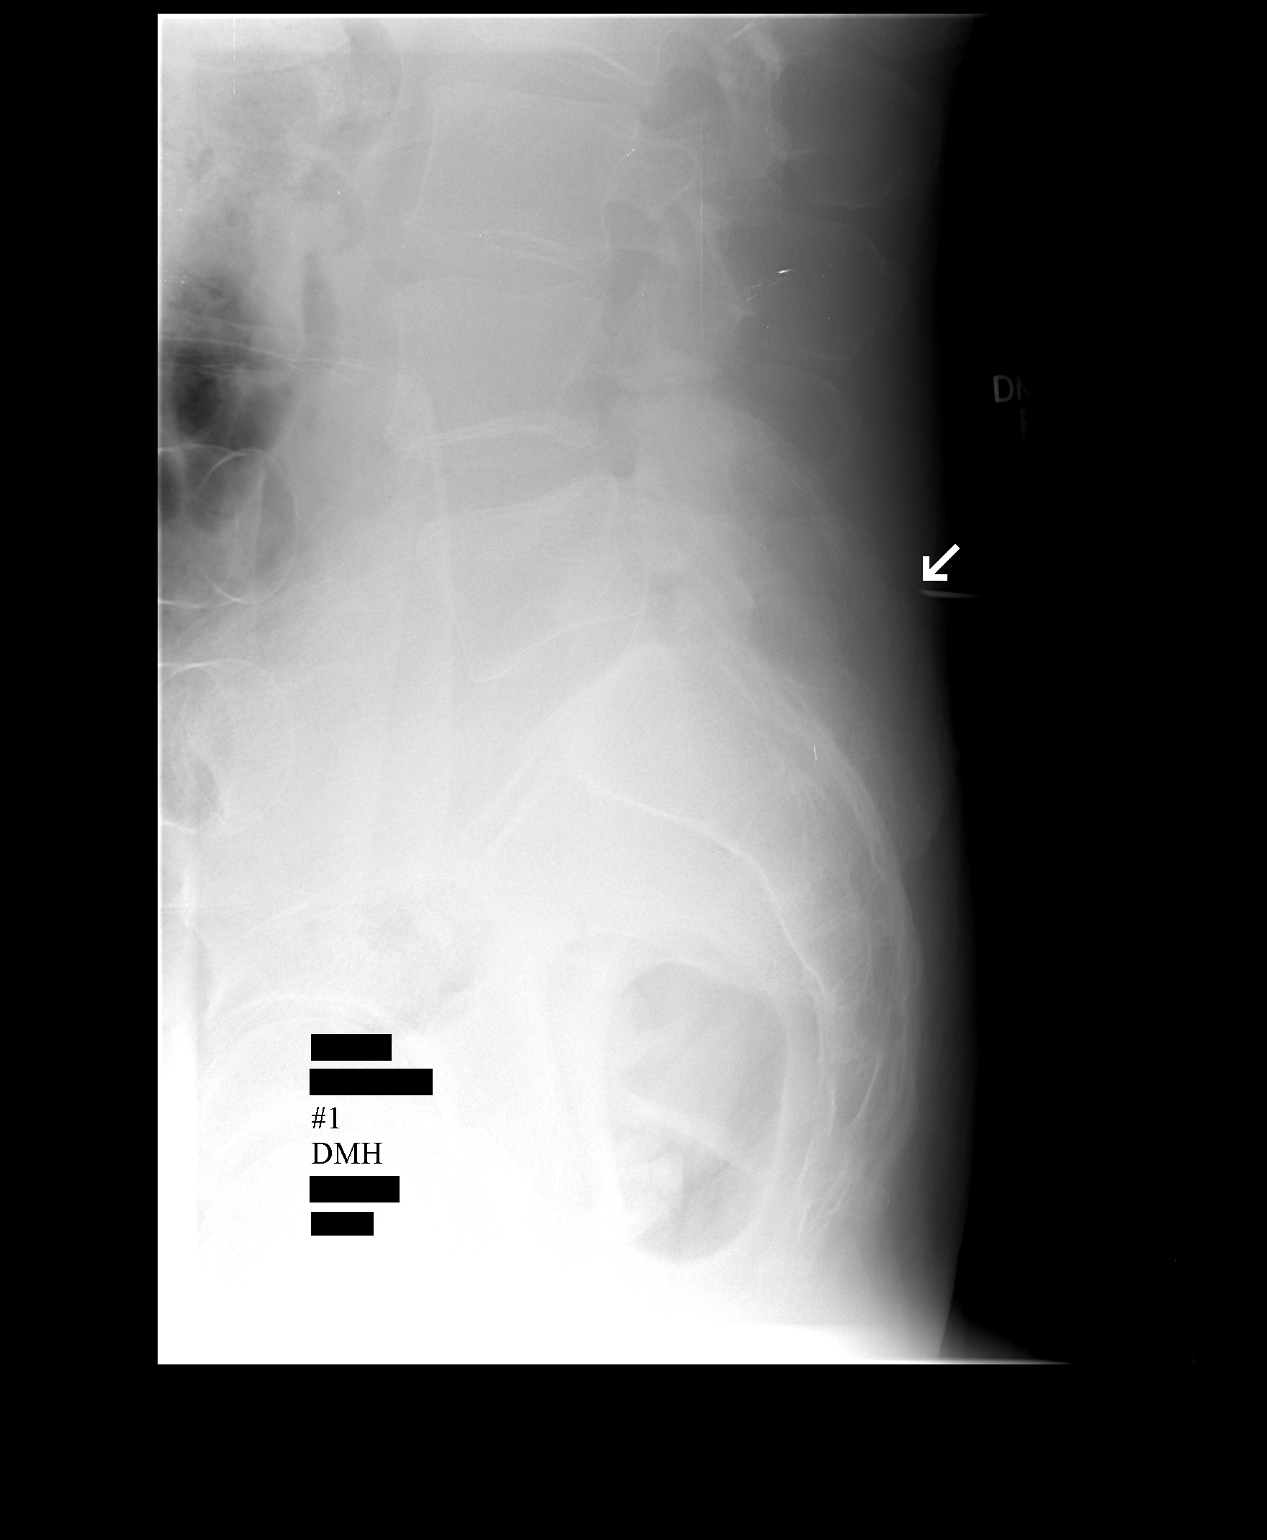

[1 of 1 positions shown; findings below may reference images not displayed]

FINDINGS: Single lateral view, labeled 4369 hours.  This
demonstrates a surgical device projecting posterior to the L5-S1
junction.  Vertebral body height maintained.
IMPRESSION: Intraoperative localization of L5-S1.

## 2014-06-28 ENCOUNTER — Encounter: Payer: Self-pay | Admitting: Family Medicine

## 2014-06-28 ENCOUNTER — Ambulatory Visit (INDEPENDENT_AMBULATORY_CARE_PROVIDER_SITE_OTHER): Payer: Medicare Other | Admitting: Family Medicine

## 2014-06-28 VITALS — BP 110/80 | Temp 97.6°F | Wt 205.0 lb

## 2014-06-28 DIAGNOSIS — M545 Low back pain, unspecified: Secondary | ICD-10-CM

## 2014-06-28 DIAGNOSIS — M4316 Spondylolisthesis, lumbar region: Secondary | ICD-10-CM

## 2014-06-28 DIAGNOSIS — M4310 Spondylolisthesis, site unspecified: Secondary | ICD-10-CM

## 2014-06-28 LAB — POCT URINALYSIS DIPSTICK
BILIRUBIN UA: NEGATIVE
GLUCOSE UA: NEGATIVE
Ketones, UA: NEGATIVE
LEUKOCYTES UA: NEGATIVE
NITRITE UA: NEGATIVE
Protein, UA: NEGATIVE
RBC UA: NEGATIVE
Spec Grav, UA: 1.02
Urobilinogen, UA: 0.2
pH, UA: 6.5

## 2014-06-28 MED ORDER — TRAMADOL HCL 50 MG PO TABS: ORAL_TABLET | ORAL | Status: DC

## 2014-06-28 MED ORDER — PREDNISONE 20 MG PO TABS: ORAL_TABLET | ORAL | Status: DC

## 2014-06-28 NOTE — Patient Instructions (Signed)
Prednisone 20 mg,,,,,,,,,,, 2 tabs x3 days and taper as outlined  Tramadol 50 mg,,,,,,,,,,, one half to one tablet twice daily for pain  Avoid sitting  If you don't see any improvement in a couple weeks call and we will get you set up to begin physical therapy

## 2014-06-28 NOTE — Progress Notes (Signed)
   Subjective:    Patient ID: Jared Hardy, male    DOB: 10/29/1941, 73 y.o.   MRN: [id number]  HPI [name] is a 73 year old male who comes in today for evaluation of left back pain  He states he was riding in his car around 12 noon on Monday and noticed a gradual onset of left lumbar back pain. Does not radiate. Originally was sharp now is dull. It was recessed 7 now is about a 5. It hurts when he bends but not when he coughs or sneezes. Feels better when he lies down. He has no urinary tract symptoms but request a urinalysis.  He had a lumbar fusion for spondylolisthesis by Dr. Mikal Planeabell 2 years ago    Review of Systems    review of systems negative Objective:   Physical Exam Well-developed well-nourished male in no acute distress vital signs stable he is afebrile in the supine position his right leg was a tab quarter inch shorter than the left. Sensation reflexes muscle strength are within normal limits.       Assessment & Plan:  Lumbar disease............ treat symptomatically with anti-inflammatories.......... Travenol.

## 2014-06-28 NOTE — Progress Notes (Signed)
Pre visit review using our clinic review tool, if applicable. No additional management support is needed unless otherwise documented below in the visit note. 

## 2014-07-26 ENCOUNTER — Telehealth: Payer: Self-pay | Admitting: *Deleted

## 2014-07-26 NOTE — Telephone Encounter (Signed)
I'm a past patient of Dr. Wynelle Cleveland.  Thank you.

## 2014-07-27 NOTE — Telephone Encounter (Signed)
Dr. Wynelle Cleveland is my doctor.  Three or 4 years ago I got some orthotics from him from the place in [place].  You know if you buy their insurance they will refurbish it for you one.  I got one pair that I need refurbished now and they told me I need to go through you.  Call me back.  I'd like to figure out how to get that done, thank you.

## 2014-08-02 ENCOUNTER — Telehealth: Payer: Self-pay | Admitting: Family Medicine

## 2014-08-02 DIAGNOSIS — M4316 Spondylolisthesis, lumbar region: Secondary | ICD-10-CM

## 2014-08-02 NOTE — Telephone Encounter (Signed)
Pt would like to talk to you about his back pain.  He said Dr. Tawanna Cooler advised him to call you if the medication didn't work and discuss physical therapy.

## 2014-08-02 NOTE — Telephone Encounter (Signed)
referral placed for PT

## 2014-08-13 ENCOUNTER — Ambulatory Visit: Payer: Medicare Other | Attending: Family Medicine

## 2014-08-13 DIAGNOSIS — IMO0001 Reserved for inherently not codable concepts without codable children: Secondary | ICD-10-CM | POA: Insufficient documentation

## 2014-08-13 DIAGNOSIS — M545 Low back pain, unspecified: Secondary | ICD-10-CM | POA: Diagnosis not present

## 2014-08-13 DIAGNOSIS — R5381 Other malaise: Secondary | ICD-10-CM | POA: Diagnosis not present

## 2014-08-20 ENCOUNTER — Ambulatory Visit: Payer: Medicare Other | Attending: Family Medicine

## 2014-08-20 DIAGNOSIS — M545 Low back pain: Secondary | ICD-10-CM | POA: Insufficient documentation

## 2014-08-20 DIAGNOSIS — Z5189 Encounter for other specified aftercare: Secondary | ICD-10-CM | POA: Diagnosis not present

## 2014-08-20 DIAGNOSIS — R5381 Other malaise: Secondary | ICD-10-CM | POA: Diagnosis not present

## 2014-08-22 ENCOUNTER — Ambulatory Visit: Payer: Medicare Other

## 2014-09-04 ENCOUNTER — Ambulatory Visit: Payer: Medicare Other

## 2014-09-04 DIAGNOSIS — Z5189 Encounter for other specified aftercare: Secondary | ICD-10-CM | POA: Diagnosis not present

## 2014-09-12 ENCOUNTER — Ambulatory Visit: Payer: Medicare Other

## 2014-09-17 ENCOUNTER — Ambulatory Visit: Payer: Medicare Other | Attending: Family Medicine

## 2014-09-17 DIAGNOSIS — R5381 Other malaise: Secondary | ICD-10-CM | POA: Diagnosis not present

## 2014-09-17 DIAGNOSIS — M545 Low back pain: Secondary | ICD-10-CM | POA: Insufficient documentation

## 2014-09-17 DIAGNOSIS — Z5189 Encounter for other specified aftercare: Secondary | ICD-10-CM | POA: Diagnosis present

## 2014-09-27 ENCOUNTER — Ambulatory Visit (INDEPENDENT_AMBULATORY_CARE_PROVIDER_SITE_OTHER): Payer: Medicare Other | Admitting: Family Medicine

## 2014-09-27 ENCOUNTER — Encounter: Payer: Self-pay | Admitting: Family Medicine

## 2014-09-27 VITALS — BP 124/84 | Temp 98.3°F | Ht 70.0 in | Wt 203.0 lb

## 2014-09-27 DIAGNOSIS — Z23 Encounter for immunization: Secondary | ICD-10-CM

## 2014-09-27 DIAGNOSIS — Z0000 Encounter for general adult medical examination without abnormal findings: Secondary | ICD-10-CM

## 2014-09-27 DIAGNOSIS — F528 Other sexual dysfunction not due to a substance or known physiological condition: Secondary | ICD-10-CM

## 2014-09-27 DIAGNOSIS — Z8659 Personal history of other mental and behavioral disorders: Secondary | ICD-10-CM

## 2014-09-27 LAB — CBC WITH DIFFERENTIAL/PLATELET
BASOS ABS: 0 10*3/uL (ref 0.0–0.1)
Basophils Relative: 0.8 % (ref 0.0–3.0)
Eosinophils Absolute: 0.1 10*3/uL (ref 0.0–0.7)
Eosinophils Relative: 1.9 % (ref 0.0–5.0)
HCT: 48.5 % (ref 39.0–52.0)
Hemoglobin: 16.2 g/dL (ref 13.0–17.0)
LYMPHS PCT: 33.8 % (ref 12.0–46.0)
Lymphs Abs: 1.9 10*3/uL (ref 0.7–4.0)
MCHC: 33.3 g/dL (ref 30.0–36.0)
MCV: 96 fl (ref 78.0–100.0)
MONOS PCT: 10.3 % (ref 3.0–12.0)
Monocytes Absolute: 0.6 10*3/uL (ref 0.1–1.0)
NEUTROS PCT: 53.2 % (ref 43.0–77.0)
Neutro Abs: 3 10*3/uL (ref 1.4–7.7)
PLATELETS: 235 10*3/uL (ref 150.0–400.0)
RBC: 5.05 Mil/uL (ref 4.22–5.81)
RDW: 13.5 % (ref 11.5–15.5)
WBC: 5.7 10*3/uL (ref 4.0–10.5)

## 2014-09-27 LAB — BASIC METABOLIC PANEL
BUN: 13 mg/dL (ref 6–23)
CHLORIDE: 106 meq/L (ref 96–112)
CO2: 21 mEq/L (ref 19–32)
Calcium: 9.1 mg/dL (ref 8.4–10.5)
Creatinine, Ser: 0.8 mg/dL (ref 0.4–1.5)
GFR: 96.56 mL/min (ref >60.00)
Glucose, Bld: 102 mg/dL — ABNORMAL HIGH (ref 70–99)
POTASSIUM: 4 meq/L (ref 3.5–5.1)
Sodium: 140 mEq/L (ref 135–145)

## 2014-09-27 LAB — LIPID PANEL
CHOL/HDL RATIO: 4
Cholesterol: 150 mg/dL (ref 0–200)
HDL: 36.4 mg/dL — AB (ref >39.00)
LDL Cholesterol: 99 mg/dL (ref 0–99)
NonHDL: 113.6
TRIGLYCERIDES: 73 mg/dL (ref 0.0–149.0)
VLDL: 14.6 mg/dL (ref 0.0–40.0)

## 2014-09-27 LAB — HEPATIC FUNCTION PANEL
ALBUMIN: 3.6 g/dL (ref 3.5–5.2)
ALK PHOS: 55 U/L (ref 39–117)
ALT: 23 U/L (ref 0–53)
AST: 18 U/L (ref 0–37)
Bilirubin, Direct: 0.1 mg/dL (ref 0.0–0.3)
TOTAL PROTEIN: 7.2 g/dL (ref 6.0–8.3)
Total Bilirubin: 1 mg/dL (ref 0.2–1.2)

## 2014-09-27 LAB — TSH: TSH: 0.67 u[IU]/mL (ref 0.35–4.50)

## 2014-09-27 NOTE — Progress Notes (Signed)
   Subjective:    Patient ID: Jared Hardy, male    DOB: 06/13/1941, 73 y.o.   MRN: [id number]  HPI Jared Hardy is 73 year old married male nonsmoker who comes in today for general physical examination  He's had surgery 2,,,,,, cervical,,,,,,,,, lumbar fusion,,,,,,,, doing well. We recently gamma burst and taper prednisone because of low back pain. He also went to physical therapy and that seemed to help a lot he says. He's doing his daily exercises at home He gets routine eye care, dental care, colonoscopy by Dr. Julio Almora Brody 5 because his mother had a history of colon cancer. Last colonoscopy 2 years ago was normal he was advised he needs no further colonoscopies  Cognitive function normal he exercises on a daily basis home health safety reviewed no issues identified, no guns in the house, he does have a healthcare power of attorney and living will.  He is retired [place]   Review of Systems  Constitutional: Negative.   HENT: Negative.   Eyes: Negative.   Respiratory: Negative.   Cardiovascular: Negative.   Gastrointestinal: Negative.   Endocrine: Negative.   Genitourinary: Negative.   Musculoskeletal: Negative.   Skin: Negative.   Allergic/Immunologic: Negative.   Neurological: Negative.   Hematological: Negative.   Psychiatric/Behavioral: Negative.        Objective:   Physical Exam  Constitutional: He is oriented to person, place, and time. He appears well-developed and well-nourished.  HENT:  Head: Normocephalic and atraumatic.  Right Ear: External ear normal.  Left Ear: External ear normal.  Nose: Nose normal.  Mouth/Throat: Oropharynx is clear and moist.  Eyes: Conjunctivae and EOM are normal. Pupils are equal, round, and reactive to light.  Neck: Normal range of motion. Neck supple. No JVD present. No tracheal deviation present. No thyromegaly present.  Cardiovascular: Normal rate, regular rhythm, normal  heart sounds and intact distal pulses.  Exam reveals no gallop and no friction rub.   No murmur heard. No carotid nor aortic bruits peripheral pulses 2+ and symmetrical  Pulmonary/Chest: Effort normal and breath sounds normal. No stridor. No respiratory distress. He has no wheezes. He has no rales. He exhibits no tenderness.  Abdominal: Soft. Bowel sounds are normal. He exhibits no distension and no mass. There is no tenderness. There is no rebound and no guarding.  Genitourinary: Rectum normal.  History of BPH and mild ED,,,,, following urology by Dr. Necessary,,,,,,,,, recent  Genitorectal exam done by him therefore not repeated he tells me his PSA was 2  Musculoskeletal: Normal range of motion. He exhibits no edema or tenderness.  Lymphadenopathy:    He has no cervical adenopathy.  Neurological: He is alert and oriented to person, place, and time. He has normal reflexes. No cranial nerve deficit. He exhibits normal muscle tone.  Skin: Skin is warm and dry. No rash noted. No erythema. No pallor.  Total body skin exam normal he has a garden variety of freckles moles seborrheic keratosis capillary hemangioma  Psychiatric: He has a normal mood and affect. His behavior is normal. Judgment and thought content normal.  Nursing note and vitals reviewed.         Assessment & Plan:  Healthy male  History of BPH and ED,,,,,,, followed by urology  History of depression,,,,,, seems to be doing okay

## 2014-09-27 NOTE — Patient Instructions (Signed)
Continue daily exercise program  Return in one year sooner if any problems  The physician I no and [place] is Dr. Bernette MayersPamela Allen,,,,,,,,,,,, orthopedist and foot surgeon

## 2014-09-27 NOTE — Progress Notes (Signed)
Pre visit review using our clinic review tool, if applicable. No additional management support is needed unless otherwise documented below in the visit note. 

## 2014-10-10 ENCOUNTER — Telehealth: Payer: Self-pay | Admitting: Family Medicine

## 2014-10-10 NOTE — Telephone Encounter (Signed)
Pt would like a call back about his labs and plans that him and Dr Tawanna Coolerodd discuss

## 2014-10-15 NOTE — Telephone Encounter (Signed)
Reviewed with labs with patient.

## 2014-11-26 DIAGNOSIS — M722 Plantar fascial fibromatosis: Secondary | ICD-10-CM

## 2015-01-23 ENCOUNTER — Telehealth: Payer: Self-pay | Admitting: *Deleted

## 2015-01-23 MED ORDER — ZOSTER VACCINE LIVE 19400 UNT/0.65ML ~~LOC~~ SOLR: 0.6500 mL | Freq: Once | SUBCUTANEOUS | Status: AC

## 2015-01-23 NOTE — Telephone Encounter (Signed)
Rx sent to pharmacy. Left message on machine for patient.  

## 2016-01-28 ENCOUNTER — Other Ambulatory Visit: Payer: Self-pay | Admitting: Neurosurgery

## 2016-01-28 ENCOUNTER — Ambulatory Visit (INDEPENDENT_AMBULATORY_CARE_PROVIDER_SITE_OTHER): Payer: Medicare Other

## 2016-01-28 ENCOUNTER — Other Ambulatory Visit: Payer: Self-pay | Admitting: Pediatrics

## 2016-01-28 DIAGNOSIS — M4186 Other forms of scoliosis, lumbar region: Secondary | ICD-10-CM | POA: Diagnosis not present

## 2016-01-28 DIAGNOSIS — R52 Pain, unspecified: Secondary | ICD-10-CM

## 2016-01-28 DIAGNOSIS — M545 Low back pain: Principal | ICD-10-CM

## 2016-01-28 DIAGNOSIS — G8929 Other chronic pain: Secondary | ICD-10-CM

## 2019-12-15 ENCOUNTER — Ambulatory Visit: Payer: Medicare Other
